# Patient Record
Sex: Female | Born: 1985
Health system: Southern US, Community
[De-identification: ages and names within clinical notes are randomized; demographics above are authoritative.]

## PROBLEM LIST (undated history)

## (undated) ENCOUNTER — Inpatient Hospital Stay (HOSPITAL_COMMUNITY): Payer: Self-pay

## (undated) DIAGNOSIS — Z5189 Encounter for other specified aftercare: Secondary | ICD-10-CM

## (undated) DIAGNOSIS — K219 Gastro-esophageal reflux disease without esophagitis: Secondary | ICD-10-CM

## (undated) DIAGNOSIS — Z87442 Personal history of urinary calculi: Secondary | ICD-10-CM

## (undated) DIAGNOSIS — R002 Palpitations: Secondary | ICD-10-CM

## (undated) DIAGNOSIS — N200 Calculus of kidney: Secondary | ICD-10-CM

## (undated) DIAGNOSIS — D649 Anemia, unspecified: Secondary | ICD-10-CM

## (undated) HISTORY — PX: DILATION AND CURETTAGE OF UTERUS: SHX78

## (undated) HISTORY — PX: FOOT SURGERY: SHX648

## (undated) HISTORY — PX: PLANTAR FASCIA RELEASE: SHX2239

---
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
## 2004-05-16 ENCOUNTER — Emergency Department (HOSPITAL_COMMUNITY): Admission: EM | Admit: 2004-05-16 | Discharge: 2004-05-17 | Payer: Self-pay | Admitting: Emergency Medicine

## 2008-09-09 ENCOUNTER — Emergency Department (HOSPITAL_COMMUNITY): Admission: EM | Admit: 2008-09-09 | Discharge: 2008-09-09 | Payer: Self-pay | Admitting: Emergency Medicine

## 2010-03-15 ENCOUNTER — Inpatient Hospital Stay (HOSPITAL_COMMUNITY)
Admission: AD | Admit: 2010-03-15 | Discharge: 2010-03-15 | Disposition: A | Discharge status: Home / Self Care | Payer: BC Managed Care – PPO | Source: Ambulatory Visit | Attending: Obstetrics and Gynecology | Admitting: Obstetrics and Gynecology

## 2010-03-15 DIAGNOSIS — IMO0002 Reserved for concepts with insufficient information to code with codable children: Secondary | ICD-10-CM | POA: Insufficient documentation

## 2010-03-15 LAB — CBC
Hemoglobin: 12.4 g/dL (ref 12.0–15.0)
MCHC: 33.8 g/dL (ref 30.0–36.0)
Platelets: 250 10*3/uL (ref 150–400)
RBC: 3.98 MIL/uL (ref 3.87–5.11)

## 2010-05-03 LAB — COMPREHENSIVE METABOLIC PANEL
AST: 21 U/L (ref 0–37)
Albumin: 4.3 g/dL (ref 3.5–5.2)
Alkaline Phosphatase: 72 U/L (ref 39–117)
CO2: 28 mEq/L (ref 19–32)
Chloride: 105 mEq/L (ref 96–112)
Creatinine, Ser: 1.05 mg/dL (ref 0.4–1.2)
GFR calc Af Amer: >60 mL/min (ref >60)
GFR calc non Af Amer: >60 mL/min (ref >60)
Potassium: 3.7 mEq/L (ref 3.5–5.1)
Total Bilirubin: 1.6 mg/dL — ABNORMAL HIGH (ref 0.3–1.2)

## 2010-05-03 LAB — POCT I-STAT, CHEM 8
Calcium, Ion: 1.18 mmol/L (ref 1.12–1.32)
Creatinine, Ser: 1 mg/dL (ref 0.4–1.2)
Glucose, Bld: 108 mg/dL — ABNORMAL HIGH (ref 70–99)
Hemoglobin: 14.6 g/dL (ref 12.0–15.0)
Potassium: 3.7 mEq/L (ref 3.5–5.1)

## 2010-05-03 LAB — URINALYSIS, ROUTINE W REFLEX MICROSCOPIC
Bilirubin Urine: NEGATIVE
Ketones, ur: 15 mg/dL — AB
Nitrite: NEGATIVE
Protein, ur: 30 mg/dL — AB
Urobilinogen, UA: 1 mg/dL (ref 0.0–1.0)

## 2010-05-03 LAB — DIFFERENTIAL
Basophils Absolute: 0.1 10*3/uL (ref 0.0–0.1)
Basophils Relative: 1 % (ref 0–1)
Eosinophils Absolute: 0.1 10*3/uL (ref 0.0–0.7)
Eosinophils Relative: 1 % (ref 0–5)
Lymphocytes Relative: 30 % (ref 12–46)
Monocytes Absolute: 0.6 10*3/uL (ref 0.1–1.0)

## 2010-05-03 LAB — URINE CULTURE
Colony Count: NO GROWTH
Culture: NO GROWTH

## 2010-05-03 LAB — CBC
HCT: 41 % (ref 36.0–46.0)
MCV: 95.3 fL (ref 78.0–100.0)
Platelets: 277 10*3/uL (ref 150–400)
RBC: 4.3 MIL/uL (ref 3.87–5.11)
WBC: 7.8 10*3/uL (ref 4.0–10.5)

## 2010-05-03 LAB — LIPASE, BLOOD: Lipase: 24 U/L (ref 11–59)

## 2010-05-03 LAB — POCT PREGNANCY, URINE: Preg Test, Ur: NEGATIVE

## 2010-05-03 LAB — URINE MICROSCOPIC-ADD ON

## 2012-10-31 ENCOUNTER — Telehealth: Payer: Self-pay | Admitting: *Deleted

## 2012-10-31 MED ORDER — DICLOFENAC SODIUM 75 MG PO TBEC: 75.0000 mg | DELAYED_RELEASE_TABLET | Freq: Two times a day (BID) | ORAL | Status: DC

## 2012-10-31 NOTE — Telephone Encounter (Signed)
RECEIVED FAX REFILL REQUEST

## 2012-11-28 ENCOUNTER — Ambulatory Visit (INDEPENDENT_AMBULATORY_CARE_PROVIDER_SITE_OTHER): Payer: BC Managed Care – PPO | Admitting: Podiatry

## 2012-11-28 ENCOUNTER — Encounter: Payer: Self-pay | Admitting: Podiatry

## 2012-11-28 VITALS — BP 127/73 | HR 79 | Resp 16 | Ht 67.0 in | Wt 210.0 lb

## 2012-11-28 DIAGNOSIS — M722 Plantar fascial fibromatosis: Secondary | ICD-10-CM

## 2012-11-28 NOTE — Patient Instructions (Signed)
Plantar Fasciitis (Heel Spur Syndrome) with Rehab The plantar fascia is a fibrous, ligament-like, soft-tissue structure that spans the bottom of the foot. Plantar fasciitis is a condition that causes pain in the foot due to inflammation of the tissue. SYMPTOMS   Pain and tenderness on the underneath side of the foot.  Pain that worsens with standing or walking. CAUSES  Plantar fasciitis is caused by irritation and injury to the plantar fascia on the underneath side of the foot. Common mechanisms of injury include:  Direct trauma to bottom of the foot.  Damage to a small nerve that runs under the foot where the main fascia attaches to the heel bone.  Stress placed on the plantar fascia due to bone spurs. RISK INCREASES WITH:   Activities that place stress on the plantar fascia (running, jumping, pivoting, or cutting).  Poor strength and flexibility.  Improperly fitted shoes.  Tight calf muscles.  Flat feet.  Failure to warm-up properly before activity.  Obesity. PREVENTION  Warm up and stretch properly before activity.  Allow for adequate recovery between workouts.  Maintain physical fitness:  Strength, flexibility, and endurance.  Cardiovascular fitness.  Maintain a health body weight.  Avoid stress on the plantar fascia.  Wear properly fitted shoes, including arch supports for individuals who have flat feet. PROGNOSIS  If treated properly, then the symptoms of plantar fasciitis usually resolve without surgery. However, occasionally surgery is necessary. RELATED COMPLICATIONS   Recurrent symptoms that may result in a chronic condition.  Problems of the lower back that are caused by compensating for the injury, such as limping.  Pain or weakness of the foot during push-off following surgery.  Chronic inflammation, scarring, and partial or complete fascia tear, occurring more often from repeated injections. TREATMENT  Treatment initially involves the use of  ice and medication to help reduce pain and inflammation. The use of strengthening and stretching exercises may help reduce pain with activity, especially stretches of the Achilles tendon. These exercises may be performed at home or with a therapist. Your caregiver may recommend that you use heel cups of arch supports to help reduce stress on the plantar fascia. Occasionally, corticosteroid injections are given to reduce inflammation. If symptoms persist for greater than 6 months despite non-surgical (conservative), then surgery may be recommended.  MEDICATION   If pain medication is necessary, then nonsteroidal anti-inflammatory medications, such as aspirin and ibuprofen, or other minor pain relievers, such as acetaminophen, are often recommended.  Do not take pain medication within 7 days before surgery.  Prescription pain relievers may be given if deemed necessary by your caregiver. Use only as directed and only as much as you need.  Corticosteroid injections may be given by your caregiver. These injections should be reserved for the most serious cases, because they may only be given a certain number of times. HEAT AND COLD  Cold treatment (icing) relieves pain and reduces inflammation. Cold treatment should be applied for 10 to 15 minutes every 2 to 3 hours for inflammation and pain and immediately after any activity that aggravates your symptoms. Use ice packs or massage the area with a piece of ice (ice massage).  Heat treatment may be used prior to performing the stretching and strengthening activities prescribed by your caregiver, physical therapist, or athletic trainer. Use a heat pack or soak the injury in warm water. SEEK IMMEDIATE MEDICAL CARE IF:  Treatment seems to offer no benefit, or the condition worsens.  Any medications produce adverse side effects. EXERCISES RANGE   OF MOTION (ROM) AND STRETCHING EXERCISES - Plantar Fasciitis (Heel Spur Syndrome) These exercises may help you  when beginning to rehabilitate your injury. Your symptoms may resolve with or without further involvement from your physician, physical therapist or athletic trainer. While completing these exercises, remember:   Restoring tissue flexibility helps normal motion to return to the joints. This allows healthier, less painful movement and activity.  An effective stretch should be held for at least 30 seconds.  A stretch should never be painful. You should only feel a gentle lengthening or release in the stretched tissue. RANGE OF MOTION - Toe Extension, Flexion  Sit with your right / left leg crossed over your opposite knee.  Grasp your toes and gently pull them back toward the top of your foot. You should feel a stretch on the bottom of your toes and/or foot.  Hold this stretch for __________ seconds.  Now, gently pull your toes toward the bottom of your foot. You should feel a stretch on the top of your toes and or foot.  Hold this stretch for __________ seconds. Repeat __________ times. Complete this stretch __________ times per day.  RANGE OF MOTION - Ankle Dorsiflexion, Active Assisted  Remove shoes and sit on a chair that is preferably not on a carpeted surface.  Place right / left foot under knee. Extend your opposite leg for support.  Keeping your heel down, slide your right / left foot back toward the chair until you feel a stretch at your ankle or calf. If you do not feel a stretch, slide your bottom forward to the edge of the chair, while still keeping your heel down.  Hold this stretch for __________ seconds. Repeat __________ times. Complete this stretch __________ times per day.  STRETCH  Gastroc, Standing  Place hands on wall.  Extend right / left leg, keeping the front knee somewhat bent.  Slightly point your toes inward on your back foot.  Keeping your right / left heel on the floor and your knee straight, shift your weight toward the wall, not allowing your back to  arch.  You should feel a gentle stretch in the right / left calf. Hold this position for __________ seconds. Repeat __________ times. Complete this stretch __________ times per day. STRETCH  Soleus, Standing  Place hands on wall.  Extend right / left leg, keeping the other knee somewhat bent.  Slightly point your toes inward on your back foot.  Keep your right / left heel on the floor, bend your back knee, and slightly shift your weight over the back leg so that you feel a gentle stretch deep in your back calf.  Hold this position for __________ seconds. Repeat __________ times. Complete this stretch __________ times per day. STRETCH  Gastrocsoleus, Standing  Note: This exercise can place a lot of stress on your foot and ankle. Please complete this exercise only if specifically instructed by your caregiver.   Place the ball of your right / left foot on a step, keeping your other foot firmly on the same step.  Hold on to the wall or a rail for balance.  Slowly lift your other foot, allowing your body weight to press your heel down over the edge of the step.  You should feel a stretch in your right / left calf.  Hold this position for __________ seconds.  Repeat this exercise with a slight bend in your right / left knee. Repeat __________ times. Complete this stretch __________ times per day.    STRENGTHENING EXERCISES - Plantar Fasciitis (Heel Spur Syndrome)  These exercises may help you when beginning to rehabilitate your injury. They may resolve your symptoms with or without further involvement from your physician, physical therapist or athletic trainer. While completing these exercises, remember:   Muscles can gain both the endurance and the strength needed for everyday activities through controlled exercises.  Complete these exercises as instructed by your physician, physical therapist or athletic trainer. Progress the resistance and repetitions only as guided. STRENGTH - Towel  Curls  Sit in a chair positioned on a non-carpeted surface.  Place your foot on a towel, keeping your heel on the floor.  Pull the towel toward your heel by only curling your toes. Keep your heel on the floor.  If instructed by your physician, physical therapist or athletic trainer, add ____________________ at the end of the towel. Repeat __________ times. Complete this exercise __________ times per day. STRENGTH - Ankle Inversion  Secure one end of a rubber exercise band/tubing to a fixed object (table, pole). Loop the other end around your foot just before your toes.  Place your fists between your knees. This will focus your strengthening at your ankle.  Slowly, pull your big toe up and in, making sure the band/tubing is positioned to resist the entire motion.  Hold this position for __________ seconds.  Have your muscles resist the band/tubing as it slowly pulls your foot back to the starting position. Repeat __________ times. Complete this exercises __________ times per day.  Document Released: 01/12/2005 Document Revised: 04/06/2011 Document Reviewed: 04/26/2008 [employer] Patient Information 2014 [employer]. Plantar Fasciitis Plantar fasciitis is a common condition that causes foot pain. It is soreness (inflammation) of the band of tough fibrous tissue on the bottom of the foot that runs from the heel bone (calcaneus) to the ball of the foot. The cause of this soreness may be from excessive standing, poor fitting shoes, running on hard surfaces, being overweight, having an abnormal walk, or overuse (this is common in runners) of the painful foot or feet. It is also common in aerobic exercise dancers and ballet dancers. SYMPTOMS  Most people with plantar fasciitis complain of:  Severe pain in the morning on the bottom of their foot especially when taking the first steps out of bed. This pain recedes after a few minutes of walking.  Severe pain is experienced also during walking  following a long period of inactivity.  Pain is worse when walking barefoot or up stairs DIAGNOSIS   Your caregiver will diagnose this condition by examining and feeling your foot.  Special tests such as X-rays of your foot, are usually not needed. PREVENTION   Consult a sports medicine professional before beginning a new exercise program.  Walking programs offer a good workout. With walking there is a lower chance of overuse injuries common to runners. There is less impact and less jarring of the joints.  Begin all new exercise programs slowly. If problems or pain develop, decrease the amount of time or distance until you are at a comfortable level.  Wear good shoes and replace them regularly.  Stretch your foot and the heel cords at the back of the ankle (Achilles tendon) both before and after exercise.  Run or exercise on even surfaces that are not hard. For example, asphalt is better than pavement.  Do not run barefoot on hard surfaces.  If using a treadmill, vary the incline.  Do not continue to workout if you have foot or joint   problems. Seek professional help if they do not improve. HOME CARE INSTRUCTIONS   Avoid activities that cause you pain until you recover.  Use ice or cold packs on the problem or painful areas after working out.  Only take over-the-counter or prescription medicines for pain, discomfort, or fever as directed by your caregiver.  Soft shoe inserts or athletic shoes with air or gel sole cushions may be helpful.  If problems continue or become more severe, consult a sports medicine caregiver or your own health care provider. Cortisone is a potent anti-inflammatory medication that may be injected into the painful area. You can discuss this treatment with your caregiver. MAKE SURE YOU:   Understand these instructions.  Will watch your condition.  Will get help right away if you are not doing well or get worse. Document Released: 10/07/2000 Document  Revised: 04/06/2011 Document Reviewed: 12/07/2007 [employer] Patient Information 2014 [employer].  

## 2012-11-28 NOTE — Progress Notes (Signed)
Cynthia English presents today for chief complaint of pain to her right foot as well. She's had pain to the left foot and is currently scheduled for an endoscopic plantar fasciotomy of her left heel. She was wondering if we could perform the same procedure to the right heel. At the same time primarily because she does a lot of take extra time off of work. I reviewed her past medical history medications and allergies.  Objective: Pulses are palpable she has pain on palpation medial calcaneal tubercle right heel.  Assessment: Plantar fasciitis bilaterally.  Plan: I reviewed her past medical history medications and allergies we discussed the pros and cons of adding the surgery to her consent form. We added an endoscopic plantar fasciotomy to the right foot and I will followup with her 12/14/2012 for surgery.

## 2012-12-01 DIAGNOSIS — B351 Tinea unguium: Secondary | ICD-10-CM

## 2012-12-15 ENCOUNTER — Encounter: Payer: Self-pay | Admitting: Podiatry

## 2012-12-15 ENCOUNTER — Other Ambulatory Visit: Payer: Self-pay | Admitting: Podiatry

## 2012-12-15 MED ORDER — PROMETHAZINE HCL 25 MG PO TABS: 25.0000 mg | ORAL_TABLET | Freq: Three times a day (TID) | ORAL | Status: DC | PRN

## 2012-12-15 MED ORDER — CEPHALEXIN 500 MG PO CAPS: 500.0000 mg | ORAL_CAPSULE | Freq: Three times a day (TID) | ORAL | Status: DC

## 2012-12-15 MED ORDER — OXYCODONE-ACETAMINOPHEN 10-325 MG PO TABS: ORAL_TABLET | ORAL | Status: DC

## 2012-12-16 ENCOUNTER — Encounter: Payer: Self-pay | Admitting: Podiatry

## 2012-12-16 DIAGNOSIS — L600 Ingrowing nail: Secondary | ICD-10-CM

## 2012-12-16 DIAGNOSIS — M722 Plantar fascial fibromatosis: Secondary | ICD-10-CM

## 2012-12-21 ENCOUNTER — Ambulatory Visit (INDEPENDENT_AMBULATORY_CARE_PROVIDER_SITE_OTHER): Payer: BC Managed Care – PPO | Admitting: Podiatry

## 2012-12-21 ENCOUNTER — Encounter: Payer: Self-pay | Admitting: Podiatry

## 2012-12-21 VITALS — BP 118/80 | HR 94 | Temp 98.8°F | Resp 16 | Ht 67.0 in

## 2012-12-21 DIAGNOSIS — Z9889 Other specified postprocedural states: Secondary | ICD-10-CM

## 2012-12-21 MED ORDER — TRAMADOL HCL 50 MG PO TABS: 50.0000 mg | ORAL_TABLET | Freq: Three times a day (TID) | ORAL | Status: DC | PRN

## 2012-12-21 NOTE — Progress Notes (Signed)
Cynthia English presents with her mother today. She is 5 day status post endoscopic plantar fasciotomy bilateral heel as well as matrixectomy to the hallux right. She states that they're too sore to walk on that they are not painful as they were previously. She denies fever chills nausea vomiting muscle aches or pains. Some nausea since only with pain medication.  Objective: Vital signs are stable she is alert and oriented x3. Dry sterile dressings was removed demonstrates well coapted margins to the medial and lateral aspect of the bilateral heels. Sutures remained intact to the heels as well as to the hallux right which demonstrate some mild erythema no saline is drainage or odor. Mild tenderness on palpation of the plantar fascial calcaneal insertion site bilateral.  Assessment: 1 week postop EPF bilateral and hallux right.  Plan: At this point we are going to allow her to start soaking her feet in Epsom salts warm water washing been with antibacterial soap. She will cover the toe with a Band-Aid at all times to wear compression anklet second bilateral feet. Continue the use of the Cam Walker his 24/7. Followup with her in one week for suture removal

## 2012-12-28 ENCOUNTER — Encounter: Payer: Self-pay | Admitting: Podiatry

## 2012-12-28 ENCOUNTER — Ambulatory Visit (INDEPENDENT_AMBULATORY_CARE_PROVIDER_SITE_OTHER): Payer: BC Managed Care – PPO | Admitting: Podiatry

## 2012-12-28 VITALS — BP 121/82 | HR 100 | Resp 16 | Ht 67.0 in | Wt 210.0 lb

## 2012-12-28 DIAGNOSIS — Z9889 Other specified postprocedural states: Secondary | ICD-10-CM

## 2012-12-28 NOTE — Progress Notes (Signed)
Cynthia English presents today for followup of her endoscopic plantar fasciotomies bilateral and her matrixectomy to the hallux right. Her left heel is doing very well she says her right heel is quite sore. She also has tenderness to the hallux right. She denies fever chills nausea vomiting muscle aches and pains. She presents today ambulating not in her wheelchair.  Objective: Sutures are intact all surgical sites. There is no erythema edema cellulitis drainage or odor. Sutures are removed margins remain well coapted.  Assessment: Well-healing surgical foot bilateral.  Plan: Continue to soak the foot Epsom salts warm water twice a day to back into her regular shoe gear wear compression socks during the day. I will followup with her in 2 weeks

## 2013-01-11 ENCOUNTER — Ambulatory Visit (INDEPENDENT_AMBULATORY_CARE_PROVIDER_SITE_OTHER): Payer: BC Managed Care – PPO

## 2013-01-11 ENCOUNTER — Ambulatory Visit (INDEPENDENT_AMBULATORY_CARE_PROVIDER_SITE_OTHER): Payer: BC Managed Care – PPO | Admitting: Podiatry

## 2013-01-11 ENCOUNTER — Encounter: Payer: Self-pay | Admitting: Podiatry

## 2013-01-11 VITALS — BP 134/83 | HR 104 | Resp 16 | Ht 67.0 in | Wt 210.0 lb

## 2013-01-11 DIAGNOSIS — Z9889 Other specified postprocedural states: Secondary | ICD-10-CM

## 2013-01-11 NOTE — Progress Notes (Signed)
Cynthia English a presents today status post endoscopic plantar fasciotomies bilateral foot matrixectomy hallux right. She relates that she still cannot stand for long periods of time. In her feet are feeling much better. She continues to soak the hallux right does admit to scrubbing the scab from the right hallux incision site. She relates feeling in hearing a pop in her right foot near the heel several days ago and it has been sore ever since but yet again better than it was previously.  Objective: Vital signs are stable she is alert and oriented x3. Pulses are palpable bilateral. Hallux right demonstrates mild erythema no edema cellulitis drainage or odor. Appears to be healing very nicely. Incision site to the bilateral heels demonstrate well-healed surgical sites. However she still has tenderness and a palpable cordlike fibrosis beneath the bilateral heel.  Assessment: Surgical feet healing are normal process. Status post EPF bilateral and matrixectomy hallux right.  Plan: She'll go back to work January 6. I have encouraged range of motion exercises as well as a size therapy and soaking. She's to wear shoes at all times no bare feet no flip flops and sandals she understands that is amenable to it I will followup with her in one month

## 2013-02-06 NOTE — Progress Notes (Signed)
1. ENDOSCOPIC PLANTAR FASCIOTOMY LEFT AND RIGHT FOOT  2. SURGICAL MATRIXECTOMY BOTH BORDERS HALLUX RIGHT

## 2013-02-08 ENCOUNTER — Ambulatory Visit (INDEPENDENT_AMBULATORY_CARE_PROVIDER_SITE_OTHER): Payer: BC Managed Care – PPO | Admitting: Podiatry

## 2013-02-08 ENCOUNTER — Encounter: Payer: Self-pay | Admitting: Podiatry

## 2013-02-08 VITALS — BP 127/81 | HR 106 | Resp 16 | Ht 67.0 in | Wt 208.0 lb

## 2013-02-08 DIAGNOSIS — Z9889 Other specified postprocedural states: Secondary | ICD-10-CM

## 2013-02-08 NOTE — Progress Notes (Signed)
Cynthia English presents today more than 2 months status post bilateral endoscopic plantar fasciotomies and single matrixectomy hallux right. She states she doing quite well the heels are finally starting to calm down.  Objective: Vital signs are stable she is alert and oriented x3 she has some tenderness on palpation along the medial longitudinal arch. Some thickening of the scars. In the matrixectomy appears to be healing well.  Assessment: Well-healing surgical feet.  Plan: Discussed etiology pathology conservative versus surgical therapies she continue her massage therapy stretching exercises and ice therapy if necessary. I will followup with her in one month if necessary.

## 2013-03-09 ENCOUNTER — Ambulatory Visit (INDEPENDENT_AMBULATORY_CARE_PROVIDER_SITE_OTHER): Payer: BC Managed Care – PPO | Admitting: Podiatry

## 2013-03-09 ENCOUNTER — Encounter: Payer: Self-pay | Admitting: Podiatry

## 2013-03-09 VITALS — BP 124/78 | HR 97 | Resp 16 | Ht 67.0 in | Wt 207.0 lb

## 2013-03-09 DIAGNOSIS — M722 Plantar fascial fibromatosis: Secondary | ICD-10-CM

## 2013-03-09 NOTE — Progress Notes (Signed)
She presents today for followup of plantar fasciitis bilaterally. She had endoscopic plantar fasciotomy performed back in November to the bilateral heel. Currently she states that her heel feels just like it did before surgery. When asked her she's been wearing her orthotics she stated noted that she has not been wearing her orthotics in her shoes.  Objective: Vital signs are stable she is alert and oriented x3. She has pain on palpation medial continued tubercles bilateral.  Assessment: Plantar fasciitis lateral cuboid impingement associated with noncompliance.  Plan: Injected the bilateral heel today and encourage use of the orthotics. 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

## 2013-04-06 ENCOUNTER — Ambulatory Visit (INDEPENDENT_AMBULATORY_CARE_PROVIDER_SITE_OTHER): Payer: BC Managed Care – PPO | Admitting: Podiatry

## 2013-04-06 VITALS — BP 113/80 | HR 95 | Resp 16 | Ht 67.0 in | Wt 208.0 lb

## 2013-04-06 DIAGNOSIS — M722 Plantar fascial fibromatosis: Secondary | ICD-10-CM

## 2013-04-06 NOTE — Patient Instructions (Signed)
Plantar Fasciitis (Heel Spur Syndrome) with Rehab The plantar fascia is a fibrous, ligament-like, soft-tissue structure that spans the bottom of the foot. Plantar fasciitis is a condition that causes pain in the foot due to inflammation of the tissue. SYMPTOMS   Pain and tenderness on the underneath side of the foot.  Pain that worsens with standing or walking. CAUSES  Plantar fasciitis is caused by irritation and injury to the plantar fascia on the underneath side of the foot. Common mechanisms of injury include:  Direct trauma to bottom of the foot.  Damage to a small nerve that runs under the foot where the main fascia attaches to the heel bone.  Stress placed on the plantar fascia due to bone spurs. RISK INCREASES WITH:   Activities that place stress on the plantar fascia (running, jumping, pivoting, or cutting).  Poor strength and flexibility.  Improperly fitted shoes.  Tight calf muscles.  Flat feet.  Failure to warm-up properly before activity.  Obesity. PREVENTION  Warm up and stretch properly before activity.  Allow for adequate recovery between workouts.  Maintain physical fitness:  Strength, flexibility, and endurance.  Cardiovascular fitness.  Maintain a health body weight.  Avoid stress on the plantar fascia.  Wear properly fitted shoes, including arch supports for individuals who have flat feet. PROGNOSIS  If treated properly, then the symptoms of plantar fasciitis usually resolve without surgery. However, occasionally surgery is necessary. RELATED COMPLICATIONS   Recurrent symptoms that may result in a chronic condition.  Problems of the lower back that are caused by compensating for the injury, such as limping.  Pain or weakness of the foot during push-off following surgery.  Chronic inflammation, scarring, and partial or complete fascia tear, occurring more often from repeated injections. TREATMENT  Treatment initially involves the use of  ice and medication to help reduce pain and inflammation. The use of strengthening and stretching exercises may help reduce pain with activity, especially stretches of the Achilles tendon. These exercises may be performed at home or with a therapist. Your caregiver may recommend that you use heel cups of arch supports to help reduce stress on the plantar fascia. Occasionally, corticosteroid injections are given to reduce inflammation. If symptoms persist for greater than 6 months despite non-surgical (conservative), then surgery may be recommended.  MEDICATION   If pain medication is necessary, then nonsteroidal anti-inflammatory medications, such as aspirin and ibuprofen, or other minor pain relievers, such as acetaminophen, are often recommended.  Do not take pain medication within 7 days before surgery.  Prescription pain relievers may be given if deemed necessary by your caregiver. Use only as directed and only as much as you need.  Corticosteroid injections may be given by your caregiver. These injections should be reserved for the most serious cases, because they may only be given a certain number of times. HEAT AND COLD  Cold treatment (icing) relieves pain and reduces inflammation. Cold treatment should be applied for 10 to 15 minutes every 2 to 3 hours for inflammation and pain and immediately after any activity that aggravates your symptoms. Use ice packs or massage the area with a piece of ice (ice massage).  Heat treatment may be used prior to performing the stretching and strengthening activities prescribed by your caregiver, physical therapist, or athletic trainer. Use a heat pack or soak the injury in warm water. SEEK IMMEDIATE MEDICAL CARE IF:  Treatment seems to offer no benefit, or the condition worsens.  Any medications produce adverse side effects. EXERCISES RANGE   OF MOTION (ROM) AND STRETCHING EXERCISES - Plantar Fasciitis (Heel Spur Syndrome) These exercises may help you  when beginning to rehabilitate your injury. Your symptoms may resolve with or without further involvement from your physician, physical therapist or athletic trainer. While completing these exercises, remember:   Restoring tissue flexibility helps normal motion to return to the joints. This allows healthier, less painful movement and activity.  An effective stretch should be held for at least 30 seconds.  A stretch should never be painful. You should only feel a gentle lengthening or release in the stretched tissue. RANGE OF MOTION - Toe Extension, Flexion  Sit with your right / left leg crossed over your opposite knee.  Grasp your toes and gently pull them back toward the top of your foot. You should feel a stretch on the bottom of your toes and/or foot.  Hold this stretch for __________ seconds.  Now, gently pull your toes toward the bottom of your foot. You should feel a stretch on the top of your toes and or foot.  Hold this stretch for __________ seconds. Repeat __________ times. Complete this stretch __________ times per day.  RANGE OF MOTION - Ankle Dorsiflexion, Active Assisted  Remove shoes and sit on a chair that is preferably not on a carpeted surface.  Place right / left foot under knee. Extend your opposite leg for support.  Keeping your heel down, slide your right / left foot back toward the chair until you feel a stretch at your ankle or calf. If you do not feel a stretch, slide your bottom forward to the edge of the chair, while still keeping your heel down.  Hold this stretch for __________ seconds. Repeat __________ times. Complete this stretch __________ times per day.  STRETCH  Gastroc, Standing  Place hands on wall.  Extend right / left leg, keeping the front knee somewhat bent.  Slightly point your toes inward on your back foot.  Keeping your right / left heel on the floor and your knee straight, shift your weight toward the wall, not allowing your back to  arch.  You should feel a gentle stretch in the right / left calf. Hold this position for __________ seconds. Repeat __________ times. Complete this stretch __________ times per day. STRETCH  Soleus, Standing  Place hands on wall.  Extend right / left leg, keeping the other knee somewhat bent.  Slightly point your toes inward on your back foot.  Keep your right / left heel on the floor, bend your back knee, and slightly shift your weight over the back leg so that you feel a gentle stretch deep in your back calf.  Hold this position for __________ seconds. Repeat __________ times. Complete this stretch __________ times per day. STRETCH  Gastrocsoleus, Standing  Note: This exercise can place a lot of stress on your foot and ankle. Please complete this exercise only if specifically instructed by your caregiver.   Place the ball of your right / left foot on a step, keeping your other foot firmly on the same step.  Hold on to the wall or a rail for balance.  Slowly lift your other foot, allowing your body weight to press your heel down over the edge of the step.  You should feel a stretch in your right / left calf.  Hold this position for __________ seconds.  Repeat this exercise with a slight bend in your right / left knee. Repeat __________ times. Complete this stretch __________ times per day.    STRENGTHENING EXERCISES - Plantar Fasciitis (Heel Spur Syndrome)  These exercises may help you when beginning to rehabilitate your injury. They may resolve your symptoms with or without further involvement from your physician, physical therapist or athletic trainer. While completing these exercises, remember:   Muscles can gain both the endurance and the strength needed for everyday activities through controlled exercises.  Complete these exercises as instructed by your physician, physical therapist or athletic trainer. Progress the resistance and repetitions only as guided. STRENGTH - Towel  Curls  Sit in a chair positioned on a non-carpeted surface.  Place your foot on a towel, keeping your heel on the floor.  Pull the towel toward your heel by only curling your toes. Keep your heel on the floor.  If instructed by your physician, physical therapist or athletic trainer, add ____________________ at the end of the towel. Repeat __________ times. Complete this exercise __________ times per day. STRENGTH - Ankle Inversion  Secure one end of a rubber exercise band/tubing to a fixed object (table, pole). Loop the other end around your foot just before your toes.  Place your fists between your knees. This will focus your strengthening at your ankle.  Slowly, pull your big toe up and in, making sure the band/tubing is positioned to resist the entire motion.  Hold this position for __________ seconds.  Have your muscles resist the band/tubing as it slowly pulls your foot back to the starting position. Repeat __________ times. Complete this exercises __________ times per day.  Document Released: 01/12/2005 Document Revised: 04/06/2011 Document Reviewed: 04/26/2008 [employer] Patient Information 2014 [employer]. Plantar Fasciitis Plantar fasciitis is a common condition that causes foot pain. It is soreness (inflammation) of the band of tough fibrous tissue on the bottom of the foot that runs from the heel bone (calcaneus) to the ball of the foot. The cause of this soreness may be from excessive standing, poor fitting shoes, running on hard surfaces, being overweight, having an abnormal walk, or overuse (this is common in runners) of the painful foot or feet. It is also common in aerobic exercise dancers and ballet dancers. SYMPTOMS  Most people with plantar fasciitis complain of:  Severe pain in the morning on the bottom of their foot especially when taking the first steps out of bed. This pain recedes after a few minutes of walking.  Severe pain is experienced also during walking  following a long period of inactivity.  Pain is worse when walking barefoot or up stairs DIAGNOSIS   Your caregiver will diagnose this condition by examining and feeling your foot.  Special tests such as X-rays of your foot, are usually not needed. PREVENTION   Consult a sports medicine professional before beginning a new exercise program.  Walking programs offer a good workout. With walking there is a lower chance of overuse injuries common to runners. There is less impact and less jarring of the joints.  Begin all new exercise programs slowly. If problems or pain develop, decrease the amount of time or distance until you are at a comfortable level.  Wear good shoes and replace them regularly.  Stretch your foot and the heel cords at the back of the ankle (Achilles tendon) both before and after exercise.  Run or exercise on even surfaces that are not hard. For example, asphalt is better than pavement.  Do not run barefoot on hard surfaces.  If using a treadmill, vary the incline.  Do not continue to workout if you have foot or joint   problems. Seek professional help if they do not improve. HOME CARE INSTRUCTIONS   Avoid activities that cause you pain until you recover.  Use ice or cold packs on the problem or painful areas after working out.  Only take over-the-counter or prescription medicines for pain, discomfort, or fever as directed by your caregiver.  Soft shoe inserts or athletic shoes with air or gel sole cushions may be helpful.  If problems continue or become more severe, consult a sports medicine caregiver or your own health care provider. Cortisone is a potent anti-inflammatory medication that may be injected into the painful area. You can discuss this treatment with your caregiver. MAKE SURE YOU:   Understand these instructions.  Will watch your condition.  Will get help right away if you are not doing well or get worse. Document Released: 10/07/2000 Document  Revised: 04/06/2011 Document Reviewed: 12/07/2007 [employer] Patient Information 2014 [employer].  

## 2013-04-07 NOTE — Progress Notes (Signed)
She presents today for followup of her plantar fasciitis. Last time she was in we injected bilateral heels with Kenalog.  Objective: Signs are stable she is alert and oriented x3. Currently she has minimal pain on palpation medial continued tubercles bilateral.  Assessment: A little resolving plantar fasciitis bilateral.  Plan: Discussed etiology pathology conservative versus surgical therapies at this point followup with her in 4 weeks she's going to continue all other conservative therapies.

## 2013-05-03 ENCOUNTER — Ambulatory Visit: Payer: BC Managed Care – PPO | Admitting: Podiatry

## 2013-06-13 ENCOUNTER — Encounter: Payer: Self-pay | Admitting: Podiatry

## 2013-10-06 ENCOUNTER — Ambulatory Visit (HOSPITAL_COMMUNITY): Payer: BC Managed Care – PPO

## 2013-10-31 ENCOUNTER — Other Ambulatory Visit (HOSPITAL_COMMUNITY): Payer: Self-pay | Admitting: Gynecology

## 2013-10-31 DIAGNOSIS — Z3141 Encounter for fertility testing: Secondary | ICD-10-CM

## 2013-11-07 ENCOUNTER — Ambulatory Visit (HOSPITAL_COMMUNITY)
Admission: RE | Admit: 2013-11-07 | Discharge: 2013-11-07 | Disposition: A | Discharge status: Home / Self Care | Payer: BC Managed Care – PPO | Source: Ambulatory Visit | Attending: Gynecology | Admitting: Gynecology

## 2013-11-07 ENCOUNTER — Encounter (INDEPENDENT_AMBULATORY_CARE_PROVIDER_SITE_OTHER): Payer: Self-pay

## 2013-11-07 DIAGNOSIS — Z3141 Encounter for fertility testing: Secondary | ICD-10-CM | POA: Diagnosis present

## 2013-11-07 MED ORDER — IOHEXOL 300 MG/ML  SOLN
10.0000 mL | Freq: Once | INTRAMUSCULAR | Status: AC | PRN
  Administered 2013-11-07: 10 mL

## 2014-01-01 ENCOUNTER — Other Ambulatory Visit (HOSPITAL_BASED_OUTPATIENT_CLINIC_OR_DEPARTMENT_OTHER): Payer: Self-pay | Admitting: Family Medicine

## 2014-01-01 ENCOUNTER — Ambulatory Visit (HOSPITAL_BASED_OUTPATIENT_CLINIC_OR_DEPARTMENT_OTHER)
Admission: RE | Admit: 2014-01-01 | Discharge: 2014-01-01 | Disposition: A | Discharge status: Home / Self Care | Payer: BC Managed Care – PPO | Source: Ambulatory Visit | Attending: Family Medicine | Admitting: Family Medicine

## 2014-01-01 ENCOUNTER — Encounter (HOSPITAL_BASED_OUTPATIENT_CLINIC_OR_DEPARTMENT_OTHER): Payer: Self-pay

## 2014-01-01 ENCOUNTER — Other Ambulatory Visit (HOSPITAL_BASED_OUTPATIENT_CLINIC_OR_DEPARTMENT_OTHER): Payer: BC Managed Care – PPO

## 2014-01-01 DIAGNOSIS — R509 Fever, unspecified: Secondary | ICD-10-CM | POA: Insufficient documentation

## 2014-01-01 DIAGNOSIS — J9811 Atelectasis: Secondary | ICD-10-CM | POA: Insufficient documentation

## 2014-01-01 DIAGNOSIS — R109 Unspecified abdominal pain: Secondary | ICD-10-CM

## 2014-01-01 DIAGNOSIS — R1033 Periumbilical pain: Secondary | ICD-10-CM | POA: Insufficient documentation

## 2014-01-01 DIAGNOSIS — R110 Nausea: Secondary | ICD-10-CM | POA: Diagnosis present

## 2014-01-01 DIAGNOSIS — R630 Anorexia: Secondary | ICD-10-CM | POA: Diagnosis not present

## 2014-01-01 DIAGNOSIS — N200 Calculus of kidney: Secondary | ICD-10-CM | POA: Insufficient documentation

## 2014-01-01 MED ORDER — IOHEXOL 300 MG/ML  SOLN
100.0000 mL | Freq: Once | INTRAMUSCULAR | Status: AC | PRN
  Administered 2014-01-01: 100 mL via INTRAVENOUS

## 2014-01-26 HISTORY — PX: DILATION AND CURETTAGE OF UTERUS: SHX78

## 2014-04-05 ENCOUNTER — Emergency Department (HOSPITAL_COMMUNITY)
Admission: EM | Admit: 2014-04-05 | Discharge: 2014-04-05 | Disposition: A | Discharge status: Home / Self Care | Payer: BLUE CROSS/BLUE SHIELD | Attending: Emergency Medicine | Admitting: Emergency Medicine

## 2014-04-05 ENCOUNTER — Encounter (HOSPITAL_COMMUNITY): Payer: Self-pay | Admitting: *Deleted

## 2014-04-05 ENCOUNTER — Emergency Department (HOSPITAL_COMMUNITY): Payer: BLUE CROSS/BLUE SHIELD

## 2014-04-05 DIAGNOSIS — N201 Calculus of ureter: Secondary | ICD-10-CM | POA: Insufficient documentation

## 2014-04-05 DIAGNOSIS — Z79899 Other long term (current) drug therapy: Secondary | ICD-10-CM | POA: Diagnosis not present

## 2014-04-05 DIAGNOSIS — R1011 Right upper quadrant pain: Secondary | ICD-10-CM | POA: Diagnosis present

## 2014-04-05 DIAGNOSIS — Z791 Long term (current) use of non-steroidal anti-inflammatories (NSAID): Secondary | ICD-10-CM | POA: Insufficient documentation

## 2014-04-05 DIAGNOSIS — R109 Unspecified abdominal pain: Secondary | ICD-10-CM

## 2014-04-05 HISTORY — DX: Calculus of kidney: N20.0

## 2014-04-05 LAB — COMPREHENSIVE METABOLIC PANEL
ALT: 24 U/L (ref 0–35)
ANION GAP: 7 (ref 5–15)
AST: 21 U/L (ref 0–37)
Albumin: 4 g/dL (ref 3.5–5.2)
Alkaline Phosphatase: 85 U/L (ref 39–117)
BILIRUBIN TOTAL: 0.7 mg/dL (ref 0.3–1.2)
BUN: 15 mg/dL (ref 6–23)
CALCIUM: 9.4 mg/dL (ref 8.4–10.5)
CO2: 26 mmol/L (ref 19–32)
CREATININE: 1.03 mg/dL (ref 0.50–1.10)
Chloride: 106 mmol/L (ref 96–112)
GFR calc Af Amer: 84 mL/min — ABNORMAL LOW (ref >90)
GFR, EST NON AFRICAN AMERICAN: 73 mL/min — AB (ref >90)
GLUCOSE: 109 mg/dL — AB (ref 70–99)
POTASSIUM: 4.1 mmol/L (ref 3.5–5.1)
Sodium: 139 mmol/L (ref 135–145)
TOTAL PROTEIN: 7.6 g/dL (ref 6.0–8.3)

## 2014-04-05 LAB — CBC WITH DIFFERENTIAL/PLATELET
Basophils Absolute: 0 10*3/uL (ref 0.0–0.1)
Basophils Relative: 0 % (ref 0–1)
Eosinophils Absolute: 0.2 10*3/uL (ref 0.0–0.7)
Eosinophils Relative: 2 % (ref 0–5)
HCT: 41.1 % (ref 36.0–46.0)
Hemoglobin: 13.6 g/dL (ref 12.0–15.0)
LYMPHS ABS: 2.5 10*3/uL (ref 0.7–4.0)
Lymphocytes Relative: 24 % (ref 12–46)
MCH: 30.7 pg (ref 26.0–34.0)
MCHC: 33.1 g/dL (ref 30.0–36.0)
MCV: 92.8 fL (ref 78.0–100.0)
MONOS PCT: 6 % (ref 3–12)
Monocytes Absolute: 0.7 10*3/uL (ref 0.1–1.0)
NEUTROS ABS: 7.2 10*3/uL (ref 1.7–7.7)
Neutrophils Relative %: 68 % (ref 43–77)
PLATELETS: 362 10*3/uL (ref 150–400)
RBC: 4.43 MIL/uL (ref 3.87–5.11)
RDW: 11.9 % (ref 11.5–15.5)
WBC: 10.6 10*3/uL — ABNORMAL HIGH (ref 4.0–10.5)

## 2014-04-05 LAB — POC URINE PREG, ED: Preg Test, Ur: POSITIVE — AB

## 2014-04-05 LAB — URINALYSIS, ROUTINE W REFLEX MICROSCOPIC
BILIRUBIN URINE: NEGATIVE
GLUCOSE, UA: NEGATIVE mg/dL
KETONES UR: NEGATIVE mg/dL
LEUKOCYTES UA: NEGATIVE
Nitrite: NEGATIVE
PH: 6.5 (ref 5.0–8.0)
Protein, ur: NEGATIVE mg/dL
Specific Gravity, Urine: 1.027 (ref 1.005–1.030)
Urobilinogen, UA: 0.2 mg/dL (ref 0.0–1.0)

## 2014-04-05 LAB — URINE MICROSCOPIC-ADD ON

## 2014-04-05 LAB — HCG, QUANTITATIVE, PREGNANCY: HCG, BETA CHAIN, QUANT, S: 295 m[IU]/mL — AB (ref <5)

## 2014-04-05 LAB — LIPASE, BLOOD: LIPASE: 37 U/L (ref 11–59)

## 2014-04-05 MED ORDER — OXYCODONE HCL 5 MG PO TABS: 5.0000 mg | ORAL_TABLET | ORAL | Status: DC | PRN

## 2014-04-05 MED ORDER — ONDANSETRON HCL 4 MG/2ML IJ SOLN
4.0000 mg | Freq: Once | INTRAMUSCULAR | Status: AC
  Administered 2014-04-05: 4 mg via INTRAVENOUS
  Filled 2014-04-05: qty 2

## 2014-04-05 MED ORDER — NAPROXEN 500 MG PO TABS: 500.0000 mg | ORAL_TABLET | Freq: Two times a day (BID) | ORAL | Status: DC

## 2014-04-05 MED ORDER — MORPHINE SULFATE 4 MG/ML IJ SOLN
4.0000 mg | Freq: Once | INTRAMUSCULAR | Status: AC
  Administered 2014-04-05: 4 mg via INTRAVENOUS
  Filled 2014-04-05: qty 1

## 2014-04-05 MED ORDER — ONDANSETRON 8 MG PO TBDP: 8.0000 mg | ORAL_TABLET | Freq: Three times a day (TID) | ORAL | Status: DC | PRN

## 2014-04-05 MED ORDER — KETOROLAC TROMETHAMINE 30 MG/ML IJ SOLN
30.0000 mg | Freq: Once | INTRAMUSCULAR | Status: AC
  Administered 2014-04-05: 30 mg via INTRAVENOUS
  Filled 2014-04-05: qty 1

## 2014-04-05 NOTE — ED Provider Notes (Signed)
CSN: [id number]     Arrival date & time 04/05/14  0113 History  This chart was scribed for [name], MD by Annye AsaAnna Dorsett, ED Scribe. This patient was seen in room B19C/B19C and the patient's care was started at 2:03 AM.    Chief Complaint  Patient presents with  . Flank Pain   Patient is a 29 y.o. female presenting with flank pain. The history is provided by the patient. No language interpreter was used.  Flank Pain Associated symptoms include abdominal pain.    HPI Comments: Cynthia English is a 29 y.o. female with past medical history of kidney stones (10 previous episodes, last episode over a year PTA) who presents to the Emergency Department complaining of intermittent right-sided flank pain and right-sided abdominal pain beginning around 23:00. Applied pressure to RUQ increases her back pain. She notes vomiting. Patient explains that her pain is significantly worse than her prior experiences with kidney stones. She took one oxycodone at symptom onset. She denies fevers, prior abdominal surgical history.   Patient had a CT in December to rule out appendicitis.   Patient had a miscarriage of twins 5 days PTA (stopped developing at 6 weeks, passed at 13 weeks); ultrasound with OB 3 days PTA confirmed that only a small amount of blood remained, no indication that D&C needed. She is spotting at this time.   Past Medical History  Diagnosis Date  . Kidney stones    Past Surgical History  Procedure Laterality Date  . Foot surgery Bilateral    No family history on file. History  Substance Use Topics  . Smoking status: Never Smoker   . Smokeless tobacco: Never Used  . Alcohol Use: No   OB History    No data available     Review of Systems  Constitutional: Negative for fever.  Gastrointestinal: Positive for vomiting and abdominal pain.  Genitourinary: Positive for flank pain.  All other systems reviewed and are negative.  Allergies  Percocet and Prednisone  Home Medications    Prior to Admission medications   Medication Sig Start Date End Date Taking? Authorizing Provider  diclofenac (VOLTAREN) 75 MG EC tablet Take 1 tablet (75 mg total) by mouth 2 (two) times daily. 10/31/12   Max T Hyatt, DPM  promethazine (PHENERGAN) 25 MG tablet Take 1 tablet (25 mg total) by mouth every 8 (eight) hours as needed for nausea or vomiting. 12/15/12   Max T Hyatt, DPM  traMADol (ULTRAM) 50 MG tablet Take 1 tablet (50 mg total) by mouth every 8 (eight) hours as needed. 12/21/12   Max T Hyatt, DPM   BP 107/71 mmHg  Pulse 75  Temp(Src) 98 F (36.7 C) (Oral)  Resp 20  Ht 5\' 7"  (1.702 m)  Wt 215 lb (97.523 kg)  BMI 33.67 kg/m2  SpO2 100% Physical Exam  Constitutional: She is oriented to person, place, and time. She appears well-developed and well-nourished. She appears distressed (Visibly in pain).  HENT:  Head: Normocephalic and atraumatic.  Mouth/Throat: Oropharynx is clear and moist. No oropharyngeal exudate.  Moist mucous membranes  Eyes: EOM are normal. Pupils are equal, round, and reactive to light.  Neck: Normal range of motion. Neck supple. No JVD present.  Cardiovascular: Normal rate, regular rhythm, normal heart sounds and intact distal pulses.  Exam reveals no gallop and no friction rub.   No murmur heard. Pulmonary/Chest: Effort normal and breath sounds normal. No respiratory distress. She has no wheezes. She has no rales.  Abdominal:  Soft. Bowel sounds are normal. She exhibits no mass. There is tenderness (Mild diffuse abdominal pain). There is no rebound and no guarding.  Musculoskeletal: Normal range of motion. She exhibits no edema or tenderness.  Moves all extremities normally.   Lymphadenopathy:    She has no cervical adenopathy.  Neurological: She is alert and oriented to person, place, and time. She displays normal reflexes. She exhibits normal muscle tone. Coordination normal.  Skin: Skin is warm and dry. No rash noted.  Psychiatric: She has a normal  mood and affect. Her behavior is normal. Judgment and thought content normal.  Nursing note and vitals reviewed.   ED Course  Procedures   DIAGNOSTIC STUDIES: Oxygen Saturation is 100% on RA, normal by my interpretation.    COORDINATION OF CARE: 2:09 AM Discussed treatment plan with pt at bedside and pt agreed to plan.   Labs Review Labs Reviewed  CBC WITH DIFFERENTIAL/PLATELET - Abnormal; Notable for the following:    WBC 10.6 (*)    All other components within normal limits  COMPREHENSIVE METABOLIC PANEL - Abnormal; Notable for the following:    Glucose, Bld 109 (*)    GFR calc non Af Amer 73 (*)    GFR calc Af Amer 84 (*)    All other components within normal limits  URINALYSIS, ROUTINE W REFLEX MICROSCOPIC - Abnormal; Notable for the following:    Color, Urine AMBER (*)    APPearance CLOUDY (*)    Hgb urine dipstick LARGE (*)    All other components within normal limits  HCG, QUANTITATIVE, PREGNANCY - Abnormal; Notable for the following:    hCG, Beta Chain, Quant, S 295 (*)    All other components within normal limits  POC URINE PREG, ED - Abnormal; Notable for the following:    Preg Test, Ur POSITIVE (*)    All other components within normal limits  LIPASE, BLOOD  URINE MICROSCOPIC-ADD ON    Imaging Review Ct Renal Stone Study  04/05/2014   CLINICAL DATA:  Right flank pain for 4 hours.  Recent miscarriage.  EXAM: CT ABDOMEN AND PELVIS WITHOUT CONTRAST  TECHNIQUE: Multidetector CT imaging of the abdomen and pelvis was performed following the standard protocol without IV contrast.  COMPARISON:  CT 01/01/2014  FINDINGS: The included lung bases are clear.  There is an obstructing 3 mm stone at the right ureterovesicular junction with resultant mild hydroureteronephrosis and perinephric stranding. No additional nonobstructing stones. Left kidney is normal.  The unenhanced liver, spleen, and, pancreas, and adrenal glands are normal. There are no dilated or thickened bowel  loops. Stomach is physiologically distended. Small volume of stool throughout the colon. The appendix is normal. No free air, free fluid, or intra-abdominal fluid collection.  Abdominal aorta is normal in caliber. No retroperitoneal adenopathy.  Within the pelvis the urinary bladder is near completely decompressed. No adnexal mass. No pelvic free fluid.  There are no acute or suspicious osseous abnormalities.  IMPRESSION: Obstructing 3 mm stone at the right ureterovesicular junction with resultant mild hydroureteronephrosis and perinephric stranding.   Electronically Signed   By: Rubye Oaks M.D.   On: 04/05/2014 02:52     EKG Interpretation None      MDM   Final diagnoses:  Right flank pain  Right ureteral stone   29 year old female with right flank pain.  History of kidney stones.  Patient also with recent miscarriage on Saturday.  Negative ultrasound and OB office on Monday for retained products of conception.  Pain is somewhat different than prior kidney stones, but is right flank crampy in nature and associated with nausea and vomiting.  CT scan with 3 mm stone at [facility].  Patient updated on findings and plan.  She is feeling much better after pain medication.  She is stable for discharge home.  I personally performed the services described in this documentation, which was scribed in my presence. The recorded information has been reviewed and is accurate.       Marisa Severin, MD 04/05/14 [phone]

## 2014-04-05 NOTE — ED Notes (Signed)
Pt changed into gown, placed on pulse ox, b/p cuff, warm blanket given. Call bell in reach, family at bedside

## 2014-04-05 NOTE — ED Notes (Signed)
Pt c/o flank pain to rain side since 2300. States she has had 10 kidney stones and states that this is the most severe pain she has had. Pt states she had a miscarriage Saturday and has followed up and been cleared by her OB/GYN.

## 2014-04-05 NOTE — ED Notes (Signed)
Pt took 1 oxycodone at 2300 on 04/04/14

## 2014-04-05 NOTE — Discharge Instructions (Signed)
You have a 3 mm stone about passenger bladder.  Take medications as prescribed.  Follow-up with your urologist.  Return to the emergency part for worsening condition or new concerning symptoms.    Kidney Stones Kidney stones (urolithiasis) are deposits that form inside your kidneys. The intense pain is caused by the stone moving through the urinary tract. When the stone moves, the ureter goes into spasm around the stone. The stone is usually passed in the urine.  CAUSES   A disorder that makes certain neck glands produce too much parathyroid hormone (primary hyperparathyroidism).  A buildup of uric acid crystals, similar to gout in your joints.  Narrowing (stricture) of the ureter.  A kidney obstruction present at birth (congenital obstruction).  Previous surgery on the kidney or ureters.  Numerous kidney infections. SYMPTOMS   Feeling sick to your stomach (nauseous).  Throwing up (vomiting).  Blood in the urine (hematuria).  Pain that usually spreads (radiates) to the groin.  Frequency or urgency of urination. DIAGNOSIS   Taking a history and physical exam.  Blood or urine tests.  CT scan.  Occasionally, an examination of the inside of the urinary bladder (cystoscopy) is performed. TREATMENT   Observation.  Increasing your fluid intake.  Extracorporeal shock wave lithotripsy--This is a noninvasive procedure that uses shock waves to break up kidney stones.  Surgery may be needed if you have severe pain or persistent obstruction. There are various surgical procedures. Most of the procedures are performed with the use of small instruments. Only small incisions are needed to accommodate these instruments, so recovery time is minimized. The size, location, and chemical composition are all important variables that will determine the proper choice of action for you. Talk to your health care provider to better understand your situation so that you will minimize the risk of  injury to yourself and your kidney.  HOME CARE INSTRUCTIONS   Drink enough water and fluids to keep your urine clear or pale yellow. This will help you to pass the stone or stone fragments.  Strain all urine through the provided strainer. Keep all particulate matter and stones for your health care provider to see. The stone causing the pain may be as small as a grain of salt. It is very important to use the strainer each and every time you pass your urine. The collection of your stone will allow your health care provider to analyze it and verify that a stone has actually passed. The stone analysis will often identify what you can do to reduce the incidence of recurrences.  Only take over-the-counter or prescription medicines for pain, discomfort, or fever as directed by your health care provider.  Make a follow-up appointment with your health care provider as directed.  Get follow-up X-rays if required. The absence of pain does not always mean that the stone has passed. It may have only stopped moving. If the urine remains completely obstructed, it can cause loss of kidney function or even complete destruction of the kidney. It is your responsibility to make sure X-rays and follow-ups are completed. Ultrasounds of the kidney can show blockages and the status of the kidney. Ultrasounds are not associated with any radiation and can be performed easily in a matter of minutes. SEEK MEDICAL CARE IF:  You experience pain that is progressive and unresponsive to any pain medicine you have been prescribed. SEEK IMMEDIATE MEDICAL CARE IF:   Pain cannot be controlled with the prescribed medicine.  You have a fever or shaking  chills.  The severity or intensity of pain increases over 18 hours and is not relieved by pain medicine.  You develop a new onset of abdominal pain.  You feel faint or pass out.  You are unable to urinate. MAKE SURE YOU:   Understand these instructions.  Will watch your  condition.  Will get help right away if you are not doing well or get worse. Document Released: 01/12/2005 Document Revised: 09/14/2012 Document Reviewed: 06/15/2012 [facility] Patient Information 2015 [place], [place]. This information is not intended to replace advice given to you by your health care provider. Make sure you discuss any questions you have with your health care provider.

## 2014-07-30 ENCOUNTER — Encounter (HOSPITAL_COMMUNITY): Payer: Self-pay | Admitting: *Deleted

## 2014-07-30 ENCOUNTER — Emergency Department (HOSPITAL_COMMUNITY)
Admission: EM | Admit: 2014-07-30 | Discharge: 2014-07-30 | Disposition: A | Discharge status: Home / Self Care | Payer: BLUE CROSS/BLUE SHIELD | Attending: Emergency Medicine | Admitting: Emergency Medicine

## 2014-07-30 ENCOUNTER — Emergency Department (HOSPITAL_COMMUNITY): Payer: BLUE CROSS/BLUE SHIELD

## 2014-07-30 DIAGNOSIS — R112 Nausea with vomiting, unspecified: Secondary | ICD-10-CM | POA: Diagnosis not present

## 2014-07-30 DIAGNOSIS — R1013 Epigastric pain: Secondary | ICD-10-CM | POA: Diagnosis not present

## 2014-07-30 DIAGNOSIS — Z87442 Personal history of urinary calculi: Secondary | ICD-10-CM | POA: Diagnosis not present

## 2014-07-30 DIAGNOSIS — R079 Chest pain, unspecified: Secondary | ICD-10-CM | POA: Diagnosis present

## 2014-07-30 DIAGNOSIS — K219 Gastro-esophageal reflux disease without esophagitis: Secondary | ICD-10-CM | POA: Diagnosis not present

## 2014-07-30 DIAGNOSIS — R0602 Shortness of breath: Secondary | ICD-10-CM | POA: Insufficient documentation

## 2014-07-30 DIAGNOSIS — M545 Low back pain: Secondary | ICD-10-CM | POA: Insufficient documentation

## 2014-07-30 DIAGNOSIS — Z3202 Encounter for pregnancy test, result negative: Secondary | ICD-10-CM | POA: Insufficient documentation

## 2014-07-30 LAB — HEPATIC FUNCTION PANEL
ALBUMIN: 3.9 g/dL (ref 3.5–5.0)
ALK PHOS: 83 U/L (ref 38–126)
ALT: 19 U/L (ref 14–54)
AST: 22 U/L (ref 15–41)
BILIRUBIN DIRECT: 0.1 mg/dL (ref 0.1–0.5)
BILIRUBIN INDIRECT: 0.6 mg/dL (ref 0.3–0.9)
BILIRUBIN TOTAL: 0.7 mg/dL (ref 0.3–1.2)
Total Protein: 7.7 g/dL (ref 6.5–8.1)

## 2014-07-30 LAB — CBC
HCT: 43.1 % (ref 36.0–46.0)
HEMOGLOBIN: 14.3 g/dL (ref 12.0–15.0)
MCH: 30.6 pg (ref 26.0–34.0)
MCHC: 33.2 g/dL (ref 30.0–36.0)
MCV: 92.3 fL (ref 78.0–100.0)
PLATELETS: 345 10*3/uL (ref 150–400)
RBC: 4.67 MIL/uL (ref 3.87–5.11)
RDW: 12.1 % (ref 11.5–15.5)
WBC: 7.7 10*3/uL (ref 4.0–10.5)

## 2014-07-30 LAB — BASIC METABOLIC PANEL
Anion gap: 7 (ref 5–15)
BUN: 11 mg/dL (ref 6–20)
CHLORIDE: 105 mmol/L (ref 101–111)
CO2: 27 mmol/L (ref 22–32)
Calcium: 9.2 mg/dL (ref 8.9–10.3)
Creatinine, Ser: 0.96 mg/dL (ref 0.44–1.00)
Glucose, Bld: 88 mg/dL (ref 65–99)
Potassium: 3.8 mmol/L (ref 3.5–5.1)
Sodium: 139 mmol/L (ref 135–145)

## 2014-07-30 LAB — I-STAT TROPONIN, ED: TROPONIN I, POC: 0 ng/mL (ref 0.00–0.08)

## 2014-07-30 LAB — D-DIMER, QUANTITATIVE (NOT AT ARMC): D DIMER QUANT: 0.42 ug{FEU}/mL (ref 0.00–0.48)

## 2014-07-30 LAB — BRAIN NATRIURETIC PEPTIDE: B Natriuretic Peptide: 23.4 pg/mL (ref 0.0–100.0)

## 2014-07-30 LAB — POC URINE PREG, ED: PREG TEST UR: NEGATIVE

## 2014-07-30 LAB — LIPASE, BLOOD: Lipase: 31 U/L (ref 22–51)

## 2014-07-30 MED ORDER — SUCRALFATE 1 G PO TABS
1.0000 g | ORAL_TABLET | Freq: Three times a day (TID) | ORAL | Status: DC
Start: 2014-07-30 — End: 2014-09-05

## 2014-07-30 MED ORDER — LIDOCAINE VISCOUS 2 % MT SOLN
15.0000 mL | Freq: Once | OROMUCOSAL | Status: DC
  Filled 2014-07-30: qty 15

## 2014-07-30 MED ORDER — ALUM & MAG HYDROXIDE-SIMETH 200-200-20 MG/5ML PO SUSP
30.0000 mL | Freq: Once | ORAL | Status: DC
  Filled 2014-07-30: qty 30

## 2014-07-30 MED ORDER — GI COCKTAIL ~~LOC~~
30.0000 mL | Freq: Once | ORAL | Status: AC
  Administered 2014-07-30: 30 mL via ORAL
  Filled 2014-07-30: qty 30

## 2014-07-30 MED ORDER — PANTOPRAZOLE SODIUM 20 MG PO TBEC: 20.0000 mg | DELAYED_RELEASE_TABLET | Freq: Every day | ORAL | Status: DC

## 2014-07-30 NOTE — ED Notes (Signed)
MD at bedside. 

## 2014-07-30 NOTE — ED Provider Notes (Signed)
CSN: [id number]     Arrival date & time 07/30/14  1754 History   First MD Initiated Contact with Patient 07/30/14 1836     Chief Complaint  Patient presents with  . Chest Pain    (Consider location/radiation/quality/duration/timing/severity/associated sxs/prior Treatment) Patient is a 29 y.o. female presenting with chest pain. The history is provided by the patient.  Chest Pain Pain location:  Epigastric Pain quality: pressure   Pain radiates to:  Does not radiate Pain radiates to the back: yes   Pain severity:  Moderate Onset quality:  Gradual Timing:  Intermittent Progression:  Waxing and waning Chronicity:  New Context: eating and at rest   Context: not breathing   Relieved by:  Nothing Worsened by:  Nothing tried Ineffective treatments:  Antacids, certain positions and rest Associated symptoms: abdominal pain, back pain, heartburn, nausea, shortness of breath and vomiting   Associated symptoms: no altered mental status, no anorexia, no anxiety, no cough, no diaphoresis, no dizziness, no dysphagia, no fatigue, no fever, no headache, no palpitations and no weakness   Risk factors: obesity   Risk factors: no coronary artery disease, no diabetes mellitus, no high cholesterol, no hypertension, no immobilization, not pregnant, no prior DVT/PE and no smoking     Past Medical History  Diagnosis Date  . Kidney stones    Past Surgical History  Procedure Laterality Date  . Foot surgery Bilateral    No family history on file. History  Substance Use Topics  . Smoking status: Never Smoker   . Smokeless tobacco: Never Used  . Alcohol Use: No   OB History    No data available     Review of Systems  Constitutional: Negative for fever, diaphoresis and fatigue.  HENT: Negative for trouble swallowing.   Respiratory: Positive for chest tightness and shortness of breath. Negative for cough.   Cardiovascular: Positive for chest pain. Negative for palpitations.  Gastrointestinal:  Positive for heartburn, nausea, vomiting and abdominal pain. Negative for anorexia.  Musculoskeletal: Positive for back pain.  Neurological: Negative for dizziness, weakness, light-headedness and headaches.  Psychiatric/Behavioral: Negative for confusion.  All other systems reviewed and are negative.     Allergies  Percocet and Prednisone  Home Medications   Prior to Admission medications   Medication Sig Start Date End Date Taking? Authorizing Provider  pantoprazole (PROTONIX) 20 MG tablet Take 1 tablet (20 mg total) by mouth daily. 07/30/14   Lenell AntuJamie Tedd Cottrill, MD  sucralfate (CARAFATE) 1 G tablet Take 1 tablet (1 g total) by mouth 4 (four) times daily -  with meals and at bedtime. 07/30/14   Gwyneth SproutWhitney Plunkett, MD   BP 132/95 mmHg  Pulse 106  Temp(Src) 98 F (36.7 C) (Oral)  Resp 18  Ht 5\' 7"  (1.702 m)  Wt 216 lb 3.2 oz (98.068 kg)  BMI 33.85 kg/m2  SpO2 100%  LMP 07/09/2014  Filed Vitals:   07/30/14 2000 07/30/14 2015 07/30/14 2030 07/30/14 2044  BP: 102/67 102/61 103/66 103/66  Pulse: 80 77 92 85  Temp:      TempSrc:      Resp: 12 16 19 21   Height:      Weight:      SpO2: 100% 99% 99% 99%     Physical Exam  Constitutional: She is oriented to person, place, and time. She appears well-developed and well-nourished. No distress.  Well appearing, in NAD  HENT:  Head: Normocephalic and atraumatic.  Nose: Nose normal.  Mouth/Throat: Oropharynx is clear and moist. No  oropharyngeal exudate.  Eyes: Conjunctivae and EOM are normal. Pupils are equal, round, and reactive to light.  Neck: Normal range of motion. Neck supple.  Cardiovascular: Normal rate, regular rhythm, normal heart sounds and intact distal pulses.   No murmur heard. Pulmonary/Chest: Effort normal and breath sounds normal. No respiratory distress. She has no wheezes. She exhibits no tenderness.  No reproducible chest tenderness to palpation, normal work of breathing  Abdominal: Soft. There is tenderness. There is  no rebound and no guarding.  Soft, nondistended.  Mild tenderness to epigastric area and LUQ Negative Murphy's sign  Musculoskeletal: Normal range of motion. She exhibits no tenderness.  Lymphadenopathy:    She has no cervical adenopathy.  Neurological: She is alert and oriented to person, place, and time. No cranial nerve deficit. Coordination normal.  Skin: Skin is warm and dry. She is not diaphoretic.  Psychiatric: She has a normal mood and affect. Her behavior is normal. Judgment and thought content normal.  Nursing note and vitals reviewed.   ED Course  Procedures (including critical care time) Labs Review Labs Reviewed  CBC  BASIC METABOLIC PANEL  BRAIN NATRIURETIC PEPTIDE  D-DIMER, QUANTITATIVE (NOT AT [facility])  HEPATIC FUNCTION PANEL  LIPASE, BLOOD  I-STAT TROPOININ, ED  POC URINE PREG, ED    Imaging Review Dg Chest Port 1 View  07/30/2014   CLINICAL DATA:  Subacute onset of mid sternal chest pain and shortness of breath. Lightheadedness. Initial encounter.  EXAM: PORTABLE CHEST - 1 VIEW  COMPARISON:  None.  FINDINGS: The lungs are well-aerated and clear. There is no evidence of focal opacification, pleural effusion or pneumothorax.  The cardiomediastinal silhouette is within normal limits. No acute osseous abnormalities are seen.  IMPRESSION: No acute cardiopulmonary process seen.   Electronically Signed   By: Roanna Raider M.D.   On: 07/30/2014 18:39     EKG Interpretation   Date/Time:  Monday July 30 2014 17:59:29 EDT Ventricular Rate:  98 PR Interval:  150 QRS Duration: 86 QT Interval:  344 QTC Calculation: 439 R Axis:   92 Text Interpretation:  Normal sinus rhythm Rightward axis No significant  change since last tracing Confirmed by Anitra Lauth  MD, Alphonzo Lemmings ([id number]) on  07/30/2014 6:05:20 PM      MDM   Final diagnoses:  Gastroesophageal reflux disease, esophagitis presence not specified    Pt is a 29 yo F with hx of kidney stones and PCOS who presents  complaining of 2 weeks of epigastric pain and chest pain.  Complains of daily episodes of epigastric pain that radiates to her lower sternum and back.  Moderate cramping, pressure like sensation.  Usually happens after eating.  Associated with nausea, anxiety, SOB, and rare vomiting.  Worse at night.  Has tried Tums without improvement.  No personal or family hx of CAD in < 55 yo or PEs.  Denies any calf swelling or pain.  Did have a long trip to [place] a few days before her xx started.  Doubt PE based on daily pain that is usually associated with food but will need to rule out.  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  Will send d-dimer along with belly labs.   Given GI cocktail and she reported relief of all sx.    BNP low and troponin 0.00.  No leukocytosis, no anemia, normal electrolytes and kidney function.   Normal LFTs and lipase.   CXR benign, no evidence of hiatal  hernia.  EKG benign, no signs of ischemia   Doubt acute emergent pathology based on today's exam and work up.  Do not believe patient needs additional imaging.  Will treat for GERD with Rx for zantac.  Encouraged pt to f/u with PCP in 1-2 weeks to ensure that her sx have improved.  Given ED return precautions if her pain worsens, changes, or with any other concerns.  All questions were answered and she was discharged in good condition.    If performed, labs, EKGs, and imaging were reviewed and interpreted by myself and my attending, and incorporated in the medical decision making.  Patient was seen with ED Attending, Dr. Lawernce Keas, MD     Lenell Antu, MD 07/31/14 1425  Gwyneth Sprout, MD 08/02/14 1540

## 2014-07-30 NOTE — ED Notes (Signed)
Pt c/p CP x 2 weeks with associated lightheadedness.

## 2014-09-05 ENCOUNTER — Ambulatory Visit (INDEPENDENT_AMBULATORY_CARE_PROVIDER_SITE_OTHER): Payer: BLUE CROSS/BLUE SHIELD | Admitting: Podiatry

## 2014-09-05 ENCOUNTER — Ambulatory Visit (INDEPENDENT_AMBULATORY_CARE_PROVIDER_SITE_OTHER): Payer: BLUE CROSS/BLUE SHIELD

## 2014-09-05 VITALS — BP 113/79 | HR 86 | Resp 16

## 2014-09-05 DIAGNOSIS — M722 Plantar fascial fibromatosis: Secondary | ICD-10-CM

## 2014-09-05 MED ORDER — DICLOFENAC SODIUM 75 MG PO TBEC: 75.0000 mg | DELAYED_RELEASE_TABLET | Freq: Two times a day (BID) | ORAL | Status: DC

## 2014-09-05 NOTE — Progress Notes (Signed)
She presents today after having not seen her for a few months with a chief complaint of bilateral heel pain. She states this seems to be getting worse rather than better and continues to wear her orthotics with her tennis shoes anytime she is not working. She denies changes in her past medical history medications allergies surgery social history.  Objective: Vital signs are stable alert and oriented 3. Pulses are strongly palpable. Neurologic sensorium is intact. She has pain on palpation medial calcaneal tubercle bilateral.  Assessment: Plantar fasciitis bilateral. Gastroc equinus bilateral.  Plan: Discussed etiology and pathology conservative versus surgical therapies. Reinjected the bilateral heels today with Kenalog and local aesthetic started her on diclofenac 75 mg 1 by mouth twice a day. She was scanned for a new set of orthotics and dispensed plantar fascial braces. I will follow up with her once her orthotics arrive.

## 2014-09-26 ENCOUNTER — Encounter: Payer: Self-pay | Admitting: Podiatry

## 2014-09-26 ENCOUNTER — Ambulatory Visit (INDEPENDENT_AMBULATORY_CARE_PROVIDER_SITE_OTHER): Payer: BLUE CROSS/BLUE SHIELD | Admitting: Podiatry

## 2014-09-26 VITALS — BP 134/75 | HR 77 | Resp 18

## 2014-09-26 DIAGNOSIS — M722 Plantar fascial fibromatosis: Secondary | ICD-10-CM

## 2014-09-26 NOTE — Patient Instructions (Signed)

## 2014-09-26 NOTE — Progress Notes (Signed)
Cynthia English presents today to pick up her orthotics. She was given both oral and written going home going instructions for care and use of her orthotics. I will follow-up with her in 1 month if necessary.

## 2015-02-27 ENCOUNTER — Ambulatory Visit
Admission: RE | Admit: 2015-02-27 | Discharge: 2015-02-27 | Disposition: A | Discharge status: Home / Self Care | Payer: BLUE CROSS/BLUE SHIELD | Source: Ambulatory Visit | Attending: Internal Medicine | Admitting: Internal Medicine

## 2015-02-27 ENCOUNTER — Other Ambulatory Visit: Payer: Self-pay | Admitting: Internal Medicine

## 2015-02-27 DIAGNOSIS — M542 Cervicalgia: Secondary | ICD-10-CM

## 2015-05-07 DIAGNOSIS — Z3161 Procreative counseling and advice using natural family planning: Secondary | ICD-10-CM | POA: Diagnosis not present

## 2015-05-07 DIAGNOSIS — E288 Other ovarian dysfunction: Secondary | ICD-10-CM | POA: Diagnosis not present

## 2015-05-07 DIAGNOSIS — N978 Female infertility of other origin: Secondary | ICD-10-CM | POA: Diagnosis not present

## 2015-05-10 DIAGNOSIS — E288 Other ovarian dysfunction: Secondary | ICD-10-CM | POA: Diagnosis not present

## 2015-05-14 DIAGNOSIS — N978 Female infertility of other origin: Secondary | ICD-10-CM | POA: Diagnosis not present

## 2015-05-14 DIAGNOSIS — Z319 Encounter for procreative management, unspecified: Secondary | ICD-10-CM | POA: Diagnosis not present

## 2015-05-14 DIAGNOSIS — E288 Other ovarian dysfunction: Secondary | ICD-10-CM | POA: Diagnosis not present

## 2015-05-28 DIAGNOSIS — N978 Female infertility of other origin: Secondary | ICD-10-CM | POA: Diagnosis not present

## 2015-05-28 DIAGNOSIS — N8301 Follicular cyst of right ovary: Secondary | ICD-10-CM | POA: Diagnosis not present

## 2015-05-28 DIAGNOSIS — E288 Other ovarian dysfunction: Secondary | ICD-10-CM | POA: Diagnosis not present

## 2015-06-03 DIAGNOSIS — N978 Female infertility of other origin: Secondary | ICD-10-CM | POA: Diagnosis not present

## 2015-06-03 DIAGNOSIS — N83291 Other ovarian cyst, right side: Secondary | ICD-10-CM | POA: Diagnosis not present

## 2015-06-03 DIAGNOSIS — R102 Pelvic and perineal pain: Secondary | ICD-10-CM | POA: Diagnosis not present

## 2015-06-03 DIAGNOSIS — R1084 Generalized abdominal pain: Secondary | ICD-10-CM | POA: Diagnosis not present

## 2015-06-18 DIAGNOSIS — N978 Female infertility of other origin: Secondary | ICD-10-CM | POA: Diagnosis not present

## 2015-06-18 DIAGNOSIS — N83291 Other ovarian cyst, right side: Secondary | ICD-10-CM | POA: Diagnosis not present

## 2015-06-18 DIAGNOSIS — Z89019 Acquired absence of unspecified thumb: Secondary | ICD-10-CM | POA: Diagnosis not present

## 2015-07-01 DIAGNOSIS — N83299 Other ovarian cyst, unspecified side: Secondary | ICD-10-CM | POA: Diagnosis not present

## 2015-07-03 DIAGNOSIS — D2239 Melanocytic nevi of other parts of face: Secondary | ICD-10-CM | POA: Diagnosis not present

## 2015-07-03 DIAGNOSIS — D225 Melanocytic nevi of trunk: Secondary | ICD-10-CM | POA: Diagnosis not present

## 2015-07-09 DIAGNOSIS — H02814 Retained foreign body in left upper eyelid: Secondary | ICD-10-CM | POA: Diagnosis not present

## 2015-07-12 DIAGNOSIS — Z319 Encounter for procreative management, unspecified: Secondary | ICD-10-CM | POA: Diagnosis not present

## 2015-07-15 DIAGNOSIS — Z319 Encounter for procreative management, unspecified: Secondary | ICD-10-CM | POA: Diagnosis not present

## 2015-07-17 DIAGNOSIS — Z3189 Encounter for other procreative management: Secondary | ICD-10-CM | POA: Diagnosis not present

## 2015-08-12 ENCOUNTER — Ambulatory Visit
Admission: RE | Admit: 2015-08-12 | Discharge: 2015-08-12 | Disposition: A | Discharge status: Home / Self Care | Payer: BLUE CROSS/BLUE SHIELD | Source: Ambulatory Visit | Attending: Nurse Practitioner | Admitting: Nurse Practitioner

## 2015-08-12 ENCOUNTER — Other Ambulatory Visit: Payer: Self-pay | Admitting: Nurse Practitioner

## 2015-08-12 DIAGNOSIS — R34 Anuria and oliguria: Secondary | ICD-10-CM | POA: Diagnosis not present

## 2015-08-12 DIAGNOSIS — R112 Nausea with vomiting, unspecified: Secondary | ICD-10-CM

## 2015-08-12 DIAGNOSIS — R197 Diarrhea, unspecified: Secondary | ICD-10-CM

## 2015-08-12 DIAGNOSIS — R1084 Generalized abdominal pain: Secondary | ICD-10-CM | POA: Diagnosis not present

## 2015-08-12 DIAGNOSIS — K7689 Other specified diseases of liver: Secondary | ICD-10-CM | POA: Diagnosis not present

## 2015-08-12 DIAGNOSIS — R1011 Right upper quadrant pain: Secondary | ICD-10-CM

## 2015-08-13 ENCOUNTER — Ambulatory Visit (HOSPITAL_COMMUNITY)
Admission: RE | Admit: 2015-08-13 | Discharge: 2015-08-13 | Disposition: A | Discharge status: Home / Self Care | Payer: BLUE CROSS/BLUE SHIELD | Source: Ambulatory Visit | Attending: Nurse Practitioner | Admitting: Nurse Practitioner

## 2015-08-13 ENCOUNTER — Other Ambulatory Visit (HOSPITAL_COMMUNITY): Payer: Self-pay | Admitting: Nurse Practitioner

## 2015-08-13 DIAGNOSIS — E282 Polycystic ovarian syndrome: Secondary | ICD-10-CM | POA: Diagnosis not present

## 2015-08-13 DIAGNOSIS — K769 Liver disease, unspecified: Secondary | ICD-10-CM

## 2015-08-13 DIAGNOSIS — Z319 Encounter for procreative management, unspecified: Secondary | ICD-10-CM | POA: Diagnosis not present

## 2015-08-13 DIAGNOSIS — K760 Fatty (change of) liver, not elsewhere classified: Secondary | ICD-10-CM | POA: Insufficient documentation

## 2015-08-13 DIAGNOSIS — K7689 Other specified diseases of liver: Secondary | ICD-10-CM | POA: Insufficient documentation

## 2015-08-13 MED ORDER — GADOXETATE DISODIUM 0.25 MMOL/ML IV SOLN
10.0000 mL | Freq: Once | INTRAVENOUS | Status: AC | PRN
  Administered 2015-08-13: 10 mL via INTRAVENOUS

## 2015-09-04 ENCOUNTER — Encounter: Payer: Self-pay | Admitting: Podiatry

## 2015-09-04 ENCOUNTER — Ambulatory Visit (INDEPENDENT_AMBULATORY_CARE_PROVIDER_SITE_OTHER): Payer: BLUE CROSS/BLUE SHIELD | Admitting: Podiatry

## 2015-09-04 DIAGNOSIS — M722 Plantar fascial fibromatosis: Secondary | ICD-10-CM

## 2015-09-04 NOTE — Progress Notes (Signed)
She presents today for chief complaint of bilateral heel pain.  Objective: Vital signs are stable alert and oriented 3. His pulses are palpable. She has pain on direct for patient to continue tubercles bilateral.  Assessment: Chronic intractable plantar fasciitis bilateral.  Plan: Injected bilateral heels lived alone local anesthetic provided plantar fascia strapping bilaterally.

## 2015-09-10 DIAGNOSIS — Z319 Encounter for procreative management, unspecified: Secondary | ICD-10-CM | POA: Diagnosis not present

## 2015-09-10 DIAGNOSIS — E282 Polycystic ovarian syndrome: Secondary | ICD-10-CM | POA: Diagnosis not present

## 2015-09-12 DIAGNOSIS — Z3189 Encounter for other procreative management: Secondary | ICD-10-CM | POA: Diagnosis not present

## 2015-09-12 DIAGNOSIS — E282 Polycystic ovarian syndrome: Secondary | ICD-10-CM | POA: Diagnosis not present

## 2015-10-02 ENCOUNTER — Ambulatory Visit (INDEPENDENT_AMBULATORY_CARE_PROVIDER_SITE_OTHER): Payer: BLUE CROSS/BLUE SHIELD | Admitting: Podiatry

## 2015-10-02 ENCOUNTER — Encounter: Payer: Self-pay | Admitting: Podiatry

## 2015-10-02 DIAGNOSIS — M722 Plantar fascial fibromatosis: Secondary | ICD-10-CM

## 2015-10-02 DIAGNOSIS — N912 Amenorrhea, unspecified: Secondary | ICD-10-CM | POA: Diagnosis not present

## 2015-10-02 NOTE — Progress Notes (Signed)
She presents today for bilateral plantar fasciitis. She states that she is approximately 50% improved and thinks one more injection will do the trick.  Objective: Vital signs are stable alert and oriented 3. Pulses are palpable. She has pain on palpation medial calcaneal tubercles but much less than previously noted.  Assessment: Plantar fasciitis slowly resolving 50% improved.  Plan: Reinject today bilateral heels with Kenalog and local anesthetic. Follow-up with her in 1 month if necessary.

## 2015-11-03 DIAGNOSIS — J0100 Acute maxillary sinusitis, unspecified: Secondary | ICD-10-CM | POA: Diagnosis not present

## 2015-11-22 DIAGNOSIS — Z01419 Encounter for gynecological examination (general) (routine) without abnormal findings: Secondary | ICD-10-CM | POA: Diagnosis not present

## 2015-11-22 DIAGNOSIS — Z6835 Body mass index (BMI) 35.0-35.9, adult: Secondary | ICD-10-CM | POA: Diagnosis not present

## 2016-01-29 DIAGNOSIS — N939 Abnormal uterine and vaginal bleeding, unspecified: Secondary | ICD-10-CM | POA: Diagnosis not present

## 2016-01-29 DIAGNOSIS — N920 Excessive and frequent menstruation with regular cycle: Secondary | ICD-10-CM | POA: Diagnosis not present

## 2016-03-06 DIAGNOSIS — N926 Irregular menstruation, unspecified: Secondary | ICD-10-CM | POA: Diagnosis not present

## 2016-03-07 DIAGNOSIS — Z319 Encounter for procreative management, unspecified: Secondary | ICD-10-CM | POA: Diagnosis not present

## 2016-04-13 DIAGNOSIS — N970 Female infertility associated with anovulation: Secondary | ICD-10-CM | POA: Diagnosis not present

## 2016-04-13 DIAGNOSIS — N926 Irregular menstruation, unspecified: Secondary | ICD-10-CM | POA: Diagnosis not present

## 2016-04-13 DIAGNOSIS — Z319 Encounter for procreative management, unspecified: Secondary | ICD-10-CM | POA: Diagnosis not present

## 2016-04-13 DIAGNOSIS — N978 Female infertility of other origin: Secondary | ICD-10-CM | POA: Diagnosis not present

## 2016-04-13 DIAGNOSIS — E288 Other ovarian dysfunction: Secondary | ICD-10-CM | POA: Diagnosis not present

## 2016-04-15 DIAGNOSIS — Z319 Encounter for procreative management, unspecified: Secondary | ICD-10-CM | POA: Diagnosis not present

## 2016-04-15 DIAGNOSIS — Z3141 Encounter for fertility testing: Secondary | ICD-10-CM | POA: Diagnosis not present

## 2016-04-15 DIAGNOSIS — N8500 Endometrial hyperplasia, unspecified: Secondary | ICD-10-CM | POA: Diagnosis not present

## 2016-04-15 DIAGNOSIS — Z113 Encounter for screening for infections with a predominantly sexual mode of transmission: Secondary | ICD-10-CM | POA: Diagnosis not present

## 2016-04-30 DIAGNOSIS — N978 Female infertility of other origin: Secondary | ICD-10-CM | POA: Diagnosis not present

## 2016-04-30 DIAGNOSIS — Z31430 Encounter of female for testing for genetic disease carrier status for procreative management: Secondary | ICD-10-CM | POA: Diagnosis not present

## 2016-04-30 DIAGNOSIS — N926 Irregular menstruation, unspecified: Secondary | ICD-10-CM | POA: Diagnosis not present

## 2016-04-30 DIAGNOSIS — N970 Female infertility associated with anovulation: Secondary | ICD-10-CM | POA: Diagnosis not present

## 2016-05-09 DIAGNOSIS — N926 Irregular menstruation, unspecified: Secondary | ICD-10-CM | POA: Diagnosis not present

## 2016-05-09 DIAGNOSIS — E288 Other ovarian dysfunction: Secondary | ICD-10-CM | POA: Diagnosis not present

## 2016-05-11 DIAGNOSIS — Z31440 Encounter of male for testing for genetic disease carrier status for procreative management: Secondary | ICD-10-CM | POA: Diagnosis not present

## 2016-05-11 DIAGNOSIS — N978 Female infertility of other origin: Secondary | ICD-10-CM | POA: Diagnosis not present

## 2016-05-11 DIAGNOSIS — N926 Irregular menstruation, unspecified: Secondary | ICD-10-CM | POA: Diagnosis not present

## 2016-05-11 DIAGNOSIS — N970 Female infertility associated with anovulation: Secondary | ICD-10-CM | POA: Diagnosis not present

## 2016-05-26 DIAGNOSIS — Z3200 Encounter for pregnancy test, result unknown: Secondary | ICD-10-CM | POA: Diagnosis not present

## 2016-05-28 DIAGNOSIS — Z3201 Encounter for pregnancy test, result positive: Secondary | ICD-10-CM | POA: Diagnosis not present

## 2016-06-01 DIAGNOSIS — H5213 Myopia, bilateral: Secondary | ICD-10-CM | POA: Diagnosis not present

## 2016-06-10 DIAGNOSIS — Z3200 Encounter for pregnancy test, result unknown: Secondary | ICD-10-CM | POA: Diagnosis not present

## 2016-06-18 ENCOUNTER — Other Ambulatory Visit: Payer: Self-pay | Admitting: Internal Medicine

## 2016-06-18 ENCOUNTER — Ambulatory Visit
Admission: RE | Admit: 2016-06-18 | Discharge: 2016-06-18 | Disposition: A | Discharge status: Home / Self Care | Payer: BLUE CROSS/BLUE SHIELD | Source: Ambulatory Visit | Attending: Internal Medicine | Admitting: Internal Medicine

## 2016-06-18 DIAGNOSIS — M7989 Other specified soft tissue disorders: Secondary | ICD-10-CM | POA: Diagnosis not present

## 2016-06-18 DIAGNOSIS — M79644 Pain in right finger(s): Secondary | ICD-10-CM

## 2016-06-19 DIAGNOSIS — O0900 Supervision of pregnancy with history of infertility, unspecified trimester: Secondary | ICD-10-CM | POA: Diagnosis not present

## 2016-06-30 DIAGNOSIS — Z3685 Encounter for antenatal screening for Streptococcus B: Secondary | ICD-10-CM | POA: Diagnosis not present

## 2016-06-30 DIAGNOSIS — Z3401 Encounter for supervision of normal first pregnancy, first trimester: Secondary | ICD-10-CM | POA: Diagnosis not present

## 2016-06-30 LAB — OB RESULTS CONSOLE ANTIBODY SCREEN: Antibody Screen: NEGATIVE

## 2016-06-30 LAB — OB RESULTS CONSOLE ABO/RH: RH TYPE: NEGATIVE

## 2016-06-30 LAB — OB RESULTS CONSOLE HIV ANTIBODY (ROUTINE TESTING): HIV: NONREACTIVE

## 2016-06-30 LAB — OB RESULTS CONSOLE RPR: RPR: NONREACTIVE

## 2016-06-30 LAB — OB RESULTS CONSOLE GC/CHLAMYDIA
Chlamydia: NEGATIVE
Gonorrhea: NEGATIVE

## 2016-06-30 LAB — OB RESULTS CONSOLE HEPATITIS B SURFACE ANTIGEN: Hepatitis B Surface Ag: NEGATIVE

## 2016-06-30 LAB — OB RESULTS CONSOLE RUBELLA ANTIBODY, IGM: Rubella: IMMUNE

## 2016-07-10 DIAGNOSIS — Z3A10 10 weeks gestation of pregnancy: Secondary | ICD-10-CM | POA: Diagnosis not present

## 2016-07-10 DIAGNOSIS — Z3400 Encounter for supervision of normal first pregnancy, unspecified trimester: Secondary | ICD-10-CM | POA: Diagnosis not present

## 2016-07-10 DIAGNOSIS — Z113 Encounter for screening for infections with a predominantly sexual mode of transmission: Secondary | ICD-10-CM | POA: Diagnosis not present

## 2016-07-22 DIAGNOSIS — Z36 Encounter for antenatal screening for chromosomal anomalies: Secondary | ICD-10-CM | POA: Diagnosis not present

## 2016-07-22 DIAGNOSIS — Z3491 Encounter for supervision of normal pregnancy, unspecified, first trimester: Secondary | ICD-10-CM | POA: Diagnosis not present

## 2016-07-22 DIAGNOSIS — Z3682 Encounter for antenatal screening for nuchal translucency: Secondary | ICD-10-CM | POA: Diagnosis not present

## 2016-07-23 IMAGING — CR DG CHEST 1V PORT
1 series · 1 of 1 positions shown · non-contrast
Comparison: None.

CLINICAL DATA: Subacute onset of mid sternal chest pain and
shortness of breath. Lightheadedness. Initial encounter.

EXAM:
PORTABLE CHEST - 1 VIEW

[AP]
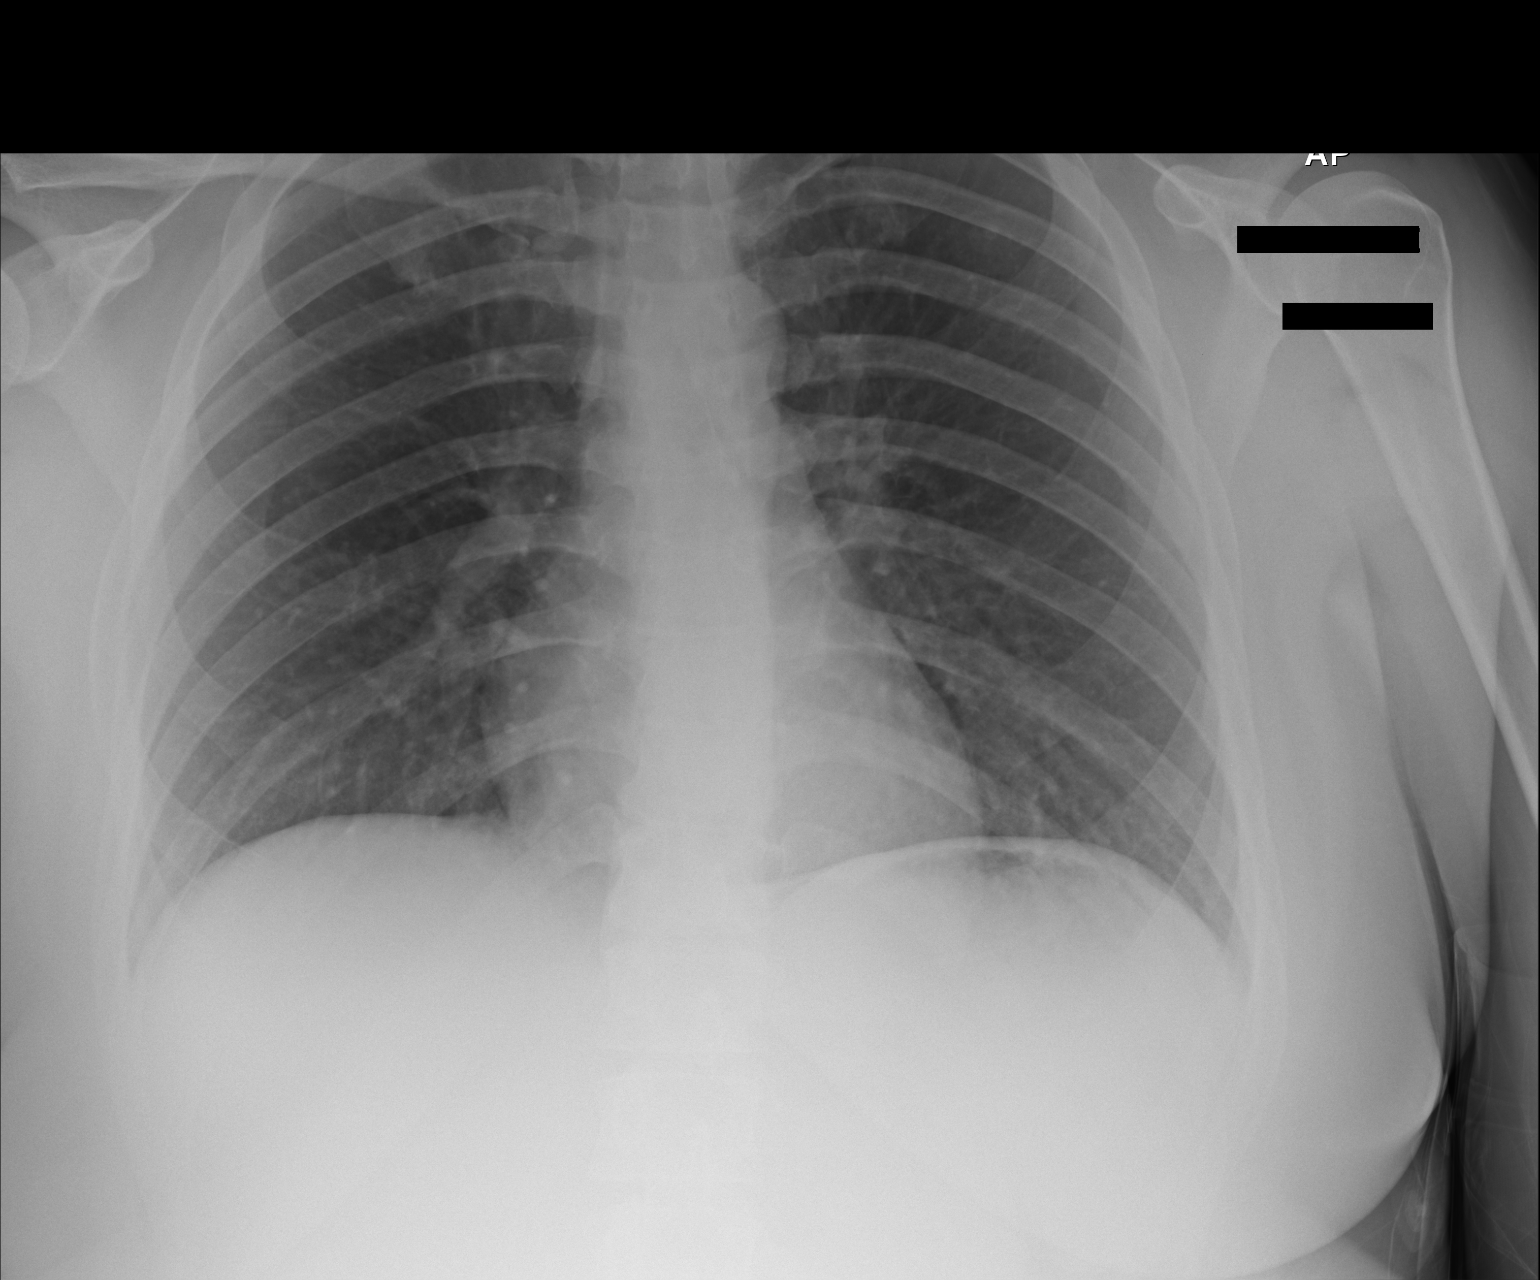

[1 of 1 positions shown; findings below may reference images not displayed]

FINDINGS: The lungs are well-aerated and clear. There is no evidence of focal
opacification, pleural effusion or pneumothorax.

The cardiomediastinal silhouette is within normal limits. No acute
osseous abnormalities are seen.
IMPRESSION: No acute cardiopulmonary process seen.

## 2016-09-09 DIAGNOSIS — Z3480 Encounter for supervision of other normal pregnancy, unspecified trimester: Secondary | ICD-10-CM | POA: Diagnosis not present

## 2016-09-09 DIAGNOSIS — Z3A19 19 weeks gestation of pregnancy: Secondary | ICD-10-CM | POA: Diagnosis not present

## 2016-09-09 DIAGNOSIS — Z363 Encounter for antenatal screening for malformations: Secondary | ICD-10-CM | POA: Diagnosis not present

## 2016-09-21 ENCOUNTER — Ambulatory Visit (INDEPENDENT_AMBULATORY_CARE_PROVIDER_SITE_OTHER): Payer: BLUE CROSS/BLUE SHIELD | Admitting: Podiatry

## 2016-09-21 ENCOUNTER — Encounter: Payer: Self-pay | Admitting: Podiatry

## 2016-09-21 DIAGNOSIS — M722 Plantar fascial fibromatosis: Secondary | ICD-10-CM | POA: Diagnosis not present

## 2016-09-21 NOTE — Progress Notes (Signed)
She presents today for follow-up of her plantar fasciitis she states that she inserted develop forefoot pain as well. She states that she is [redacted] weeks pregnant. She is on the state that her obstetrician states that we can give her injections if we do if we wanted to.  Objective: Vital signs are stable she is alert and oriented 3. She has pain on palpation medial calcaneal heels and forefoot bilateral.  Assessment: Compensatory capsulitis forefoot associated plantar fasciitis.  Plan: We made her a new pair of orthotics orthotics today no injections and no x-rays were taken.

## 2016-10-01 DIAGNOSIS — O358XX9 Maternal care for other (suspected) fetal abnormality and damage, other fetus: Secondary | ICD-10-CM | POA: Diagnosis not present

## 2016-10-06 DIAGNOSIS — Z3A22 22 weeks gestation of pregnancy: Secondary | ICD-10-CM | POA: Diagnosis not present

## 2016-10-06 DIAGNOSIS — Z362 Encounter for other antenatal screening follow-up: Secondary | ICD-10-CM | POA: Diagnosis not present

## 2016-10-19 DIAGNOSIS — Z3483 Encounter for supervision of other normal pregnancy, third trimester: Secondary | ICD-10-CM | POA: Diagnosis not present

## 2016-10-19 DIAGNOSIS — Z3482 Encounter for supervision of other normal pregnancy, second trimester: Secondary | ICD-10-CM | POA: Diagnosis not present

## 2016-10-21 ENCOUNTER — Other Ambulatory Visit: Payer: BLUE CROSS/BLUE SHIELD | Admitting: Orthotics

## 2016-11-03 DIAGNOSIS — Z3A26 26 weeks gestation of pregnancy: Secondary | ICD-10-CM | POA: Diagnosis not present

## 2016-11-03 DIAGNOSIS — R002 Palpitations: Secondary | ICD-10-CM | POA: Diagnosis not present

## 2016-11-03 DIAGNOSIS — Z3480 Encounter for supervision of other normal pregnancy, unspecified trimester: Secondary | ICD-10-CM | POA: Diagnosis not present

## 2016-11-03 DIAGNOSIS — O360920 Maternal care for other rhesus isoimmunization, second trimester, not applicable or unspecified: Secondary | ICD-10-CM | POA: Diagnosis not present

## 2016-11-10 ENCOUNTER — Ambulatory Visit (INDEPENDENT_AMBULATORY_CARE_PROVIDER_SITE_OTHER): Payer: BLUE CROSS/BLUE SHIELD | Admitting: Cardiology

## 2016-11-10 ENCOUNTER — Encounter: Payer: Self-pay | Admitting: Cardiology

## 2016-11-10 VITALS — BP 140/54 | HR 92 | Wt 254.1 lb

## 2016-11-10 DIAGNOSIS — R002 Palpitations: Secondary | ICD-10-CM | POA: Insufficient documentation

## 2016-11-10 NOTE — Progress Notes (Signed)
Cardiology Office Note:    Date:  11/10/2016   ID:  Cynthia English, DOB 08-07-1985, MRN [id number]  PCP:  Renford Dills, MD  Cardiologist:  Garwin Brothers, MD   Referring MD: Renford Dills, MD    ASSESSMENT:    1. Palpitations    PLAN:    In order of problems listed above:  1. I reassured the patient about my findings. Her blood pressure stable. She tells me that her much blood pressure is much more better at home. Her examination is unremarkable. She is having blood work including thyroid testing by her primary care physician and we will await those results. Echocardiogram will be done to assess murmur heard on auscultation. I feel it is a benign murmur of pregnancy. She will undergo 48 hour Holter monitoring to assess her palpitations. 2. Patient will be seen in follow-up appointment in 6 months or earlier if the patient has any concerns.    Medication Adjustments/Labs and Tests Ordered: Current medicines are reviewed at length with the patient today.  Concerns regarding medicines are outlined above.  Orders Placed This Encounter  Procedures  . Holter monitor - 48 hour  . EKG 12-Lead  . ECHOCARDIOGRAM COMPLETE   No orders of the defined types were placed in this encounter.    History of Present Illness:    Cynthia English is a 31 y.o. female who is being seen today for the evaluation of palpitations at the request of Renford Dills, MD.  Patient is a pleasant 31 year old female. She tells me that she is [redacted] weeks pregnant with IVF. She mentions to me that she has palpitations on and off. The increased significantly. Now it has gotten much better. This has been without any intervention. At the time of my evaluation she is alert awake oriented cheerful and in no distress. She is never passed out or had any chest pain. She is overall in good health and denies any history of hypertension diabetes mellitus or any other significant comorbidities.  Past Medical History:    Diagnosis Date  . Kidney stones     Past Surgical History:  Procedure Laterality Date  . FOOT SURGERY Bilateral     Current Medications: Current Meds  Medication Sig  . Docosahexaenoic Acid (PRENATAL DHA PO) Take 200 mg by mouth daily.     Allergies:   Percocet [oxycodone-acetaminophen] and Prednisone   Social History   Social History  . Marital status: Married    Spouse name: N/A  . Number of children: N/A  . Years of education: N/A   Social History Main Topics  . Smoking status: Never Smoker  . Smokeless tobacco: Never Used  . Alcohol use No  . Drug use: No  . Sexual activity: Not Asked   Other Topics Concern  . None   Social History Narrative  . None     Family History: The patient's family history includes Arrhythmia in her father; Supraventricular tachycardia in her father.  ROS:   Please see the history of present illness.    All other systems reviewed and are negative.  EKGs/Labs/Other Studies Reviewed:    The following studies were reviewed today:  I reviewed lab work and EKGs extensively and discussed the findings with the patient. EKG reveals sinus rhythmand nonspecific ST-T changes.   Recent Labs: No results found for requested labs within last 8760 hours.  Recent Lipid Panel No results found for: CHOL, TRIG, HDL, CHOLHDL, VLDL, LDLCALC, LDLDIRECT  Physical Exam:  VS:  BP (!) 140/54   Pulse 92   Wt 254 lb 1.9 oz (115.3 kg)   SpO2 99%   BMI 39.80 kg/m     Wt Readings from Last 3 Encounters:  11/10/16 254 lb 1.9 oz (115.3 kg)  07/30/14 216 lb 3.2 oz (98.1 kg)  04/05/14 215 lb (97.5 kg)     GEN: Patient is in no acute distress HEENT: Normal NECK: No JVD; No carotid bruits LYMPHATICS: No lymphadenopathy CARDIAC: S1 S2 regular, 2/6 systolic murmur at the apex. RESPIRATORY:  Clear to auscultation without rales, wheezing or rhonchi  ABDOMEN: Soft, non-tender, non-distended MUSCULOSKELETAL:  No edema; No deformity  SKIN: Warm and  dry NEUROLOGIC:  Alert and oriented x 3 PSYCHIATRIC:  Normal affect    Signed, Garwin Brothers, MD  11/10/2016 11:23 AM    [facility]

## 2016-11-10 NOTE — Patient Instructions (Addendum)
Medication Instructions:  Your physician recommends that you continue on your current medications as directed. Please refer to the Current Medication list given to you today.   Labwork: None  Testing/Procedures: Your physician has requested that you have an echocardiogram. Echocardiography is a painless test that uses sound waves to create images of your heart. It provides your doctor with information about the size and shape of your heart and how well your heart's chambers and valves are working. This procedure takes approximately one hour. There are no restrictions for this procedure.  Your physician has recommended that you wear a holter monitor. Holter monitors are medical devices that record the heart's electrical activity. Doctors most often use these monitors to diagnose arrhythmias. Arrhythmias are problems with the speed or rhythm of the heartbeat. The monitor is a small, portable device. You can wear one while you do your normal daily activities. This is usually used to diagnose what is causing palpitations/syncope (passing out).    Follow-Up: 1 month  Any Other Special Instructions Will Be Listed Below (If Applicable).     If you need a refill on your cardiac medications before your next appointment, please call your pharmacy.   Holter Monitoring A Holter monitor is a small device that is used to detect abnormal heart rhythms. It clips to your clothing and is connected by wires to flat, sticky disks (electrodes) that attach to your chest. It is worn continuously for 24-48 hours. Follow these instructions at home:  Wear your Holter monitor at all times, even while exercising and sleeping, for as long as directed by your health care provider.  Make sure that the Holter monitor is safely clipped to your clothing or close to your body as recommended by your health care provider.  Do not get the monitor or wires wet.  Do not put body lotion or moisturizer on your  chest.  Keep your skin clean.  Keep a diary of your daily activities, such as walking and doing chores. If you feel that your heartbeat is abnormal or that your heart is fluttering or skipping a beat: ? Record what you are doing when it happens. ? Record what time of day the symptoms occur.  Return your Holter monitor as directed by your health care provider.  Keep all follow-up visits as directed by your health care provider. This is important. Get help right away if:  You feel lightheaded or you faint.  You have trouble breathing.  You feel pain in your chest, upper arm, or jaw.  You feel sick to your stomach and your skin is pale, cool, or damp.  You heartbeat feels unusual or abnormal. This information is not intended to replace advice given to you by your health care provider. Make sure you discuss any questions you have with your health care provider. Document Released: 10/11/2003 Document Revised: 06/20/2015 Document Reviewed: 08/21/2013 Elsevier Interactive Patient Education  2018 [employer]. Echocardiogram An echocardiogram, or echocardiography, uses sound waves (ultrasound) to produce an image of your heart. The echocardiogram is simple, painless, obtained within a short period of time, and offers valuable information to your health care provider. The images from an echocardiogram can provide information such as:  Evidence of coronary artery disease (CAD).  Heart size.  Heart muscle function.  Heart valve function.  Aneurysm detection.  Evidence of a past heart attack.  Fluid buildup around the heart.  Heart muscle thickening.  Assess heart valve function.  Tell a health care provider about:  Any  allergies you have.  All medicines you are taking, including vitamins, herbs, eye drops, creams, and over-the-counter medicines.  Any problems you or family members have had with anesthetic medicines.  Any blood disorders you have.  Any surgeries you have  had.  Any medical conditions you have.  Whether you are pregnant or may be pregnant. What happens before the procedure? No special preparation is needed. Eat and drink normally. What happens during the procedure?  In order to produce an image of your heart, gel will be applied to your chest and a wand-like tool (transducer) will be moved over your chest. The gel will help transmit the sound waves from the transducer. The sound waves will harmlessly bounce off your heart to allow the heart images to be captured in real-time motion. These images will then be recorded.  You may need an IV to receive a medicine that improves the quality of the pictures. What happens after the procedure? You may return to your normal schedule including diet, activities, and medicines, unless your health care provider tells you otherwise. This information is not intended to replace advice given to you by your health care provider. Make sure you discuss any questions you have with your health care provider. Document Released: 01/10/2000 Document Revised: 08/31/2015 Document Reviewed: 09/19/2012 Elsevier Interactive Patient Education  2017 [employer].

## 2016-11-19 DIAGNOSIS — Z23 Encounter for immunization: Secondary | ICD-10-CM | POA: Diagnosis not present

## 2016-12-04 DIAGNOSIS — N760 Acute vaginitis: Secondary | ICD-10-CM | POA: Diagnosis not present

## 2016-12-04 DIAGNOSIS — O26853 Spotting complicating pregnancy, third trimester: Secondary | ICD-10-CM | POA: Diagnosis not present

## 2016-12-04 DIAGNOSIS — O368130 Decreased fetal movements, third trimester, not applicable or unspecified: Secondary | ICD-10-CM | POA: Diagnosis not present

## 2016-12-04 DIAGNOSIS — Z3A31 31 weeks gestation of pregnancy: Secondary | ICD-10-CM | POA: Diagnosis not present

## 2016-12-04 DIAGNOSIS — O26893 Other specified pregnancy related conditions, third trimester: Secondary | ICD-10-CM | POA: Diagnosis not present

## 2016-12-09 ENCOUNTER — Telehealth: Payer: Self-pay | Admitting: Cardiology

## 2016-12-09 ENCOUNTER — Ambulatory Visit: Payer: BLUE CROSS/BLUE SHIELD

## 2016-12-09 ENCOUNTER — Ambulatory Visit (HOSPITAL_BASED_OUTPATIENT_CLINIC_OR_DEPARTMENT_OTHER): Payer: BLUE CROSS/BLUE SHIELD

## 2016-12-09 DIAGNOSIS — R002 Palpitations: Secondary | ICD-10-CM

## 2016-12-09 NOTE — Telephone Encounter (Signed)
Pt has echo and monitor today but she is going to have to pay a lot out of pocket for echo so wants to know if it is ok to just get the monitor only

## 2016-12-09 NOTE — Telephone Encounter (Signed)
Informed patient that the echo is a recommendation from Dr. Tomie Chinaevankar; however, it is ultimately her decision. The patient stated that she would like to see what the monitor shows and if the echo is needed after then she would proceed.

## 2016-12-10 ENCOUNTER — Ambulatory Visit (HOSPITAL_BASED_OUTPATIENT_CLINIC_OR_DEPARTMENT_OTHER): Payer: BLUE CROSS/BLUE SHIELD

## 2016-12-11 ENCOUNTER — Other Ambulatory Visit: Payer: Self-pay

## 2016-12-16 ENCOUNTER — Ambulatory Visit: Payer: BLUE CROSS/BLUE SHIELD | Admitting: Cardiology

## 2016-12-16 ENCOUNTER — Encounter: Payer: Self-pay | Admitting: Cardiology

## 2016-12-16 VITALS — BP 110/64 | HR 106 | Ht 67.0 in | Wt 261.8 lb

## 2016-12-16 DIAGNOSIS — R002 Palpitations: Secondary | ICD-10-CM

## 2016-12-16 NOTE — Progress Notes (Signed)
Cardiology Office Note:    Date:  12/16/2016   ID:  Cynthia English, DOB 1985/05/12, MRN [id number]  PCP:  Renford DillsPolite, Ronald, MD  Cardiologist:  Garwin Brothersajan R Revankar, MD   Referring MD: Renford DillsPolite, Ronald, MD    ASSESSMENT:    1. Palpitations    PLAN:    In order of problems listed above:  1. I reassured the patient about her palpitations.  They have gotten better.  She has tried to keep herself well-hydrated.  I tried to discuss with her about echocardiogram and she is not keen on it at this time and I respect her wishes.  Her palpitations are better.  I told her that if they worsen I could suggest a beta-blocker to her in a low dose.  However I also cautioned her about this since her blood pressure is very borderline and she vocalized understanding.  She will be seen in follow-up appointment on a as necessary basis.   Medication Adjustments/Labs and Tests Ordered: Current medicines are reviewed at length with the patient today.  Concerns regarding medicines are outlined above.  No orders of the defined types were placed in this encounter.  No orders of the defined types were placed in this encounter.    Chief Complaint  Patient presents with  . Follow-up    Still having Palpitations more than before " good and bad days"      History of Present Illness:    Cynthia English is a 31 y.o. female.  The patient is the third trimester of pregnancy.  She denies any problems at this time except palpitations.  She was evaluated for same reason.  Chest pain orthopnea or PND.  No passing out spells.  She mentions to me that her spells of palpitations are better to some extent.  Her Holter monitor has come out to be unremarkable.  She has declined to get evaluated with an echocardiogram for financial reasons.  Past Medical History:  Diagnosis Date  . Kidney stones     Past Surgical History:  Procedure Laterality Date  . FOOT SURGERY Bilateral     Current Medications: No outpatient  medications have been marked as taking for the 12/16/16 encounter (Office Visit) with Revankar, Aundra Dubinajan R, MD.     Allergies:   Percocet [oxycodone-acetaminophen] and Prednisone   Social History   Socioeconomic History  . Marital status: Married    Spouse name: None  . Number of children: None  . Years of education: None  . Highest education level: None  Social Needs  . Financial resource strain: None  . Food insecurity - worry: None  . Food insecurity - inability: None  . Transportation needs - medical: None  . Transportation needs - non-medical: None  Occupational History  . None  Tobacco Use  . Smoking status: Never Smoker  . Smokeless tobacco: Never Used  Substance and Sexual Activity  . Alcohol use: No  . Drug use: No  . Sexual activity: None  Other Topics Concern  . None  Social History Narrative  . None     Family History: The patient's family history includes Arrhythmia in her father; Supraventricular tachycardia in her father.  ROS:   Please see the history of present illness.    All other systems reviewed and are negative.  EKGs/Labs/Other Studies Reviewed:    The following studies were reviewed today: I discussed my findings with the patient extensively.  Holter monitoring report was discussed it was unremarkable and I reassured  the patient.   Recent Labs: No results found for requested labs within last 8760 hours.  Recent Lipid Panel No results found for: CHOL, TRIG, HDL, CHOLHDL, VLDL, LDLCALC, LDLDIRECT  Physical Exam:    VS:  BP 110/64   Pulse (!) 106   Ht 5\' 7"  (1.702 m)   Wt 261 lb 12.8 oz (118.8 kg)   SpO2 96%   BMI 41.00 kg/m     Wt Readings from Last 3 Encounters:  12/16/16 261 lb 12.8 oz (118.8 kg)  11/10/16 254 lb 1.9 oz (115.3 kg)  07/30/14 216 lb 3.2 oz (98.1 kg)     GEN: Patient is in no acute distress HEENT: Normal NECK: No JVD; No carotid bruits LYMPHATICS: No lymphadenopathy CARDIAC: Hear sounds regular, 2/6 systolic  murmur at the apex. RESPIRATORY:  Clear to auscultation without rales, wheezing or rhonchi  ABDOMEN: Soft, non-tender, non-distended MUSCULOSKELETAL:  No edema; No deformity  SKIN: Warm and dry NEUROLOGIC:  Alert and oriented x 3 PSYCHIATRIC:  Normal affect   Signed, Garwin Brothersajan R Revankar, MD  12/16/2016 9:01 AM    [facility]

## 2016-12-16 NOTE — Patient Instructions (Signed)
Medication Instructions:  Your physician recommends that you continue on your current medications as directed. Please refer to the Current Medication list given to you today.   Labwork: None  Testing/Procedures: None  Follow-Up: Your physician recommends that you schedule a follow-up appointment in: as needed   Any Other Special Instructions Will Be Listed Below (If Applicable).     If you need a refill on your cardiac medications before your next appointment, please call your pharmacy.   [facility]  Judy Goodenow A, RN, BSN  

## 2017-01-05 DIAGNOSIS — Z3685 Encounter for antenatal screening for Streptococcus B: Secondary | ICD-10-CM | POA: Diagnosis not present

## 2017-01-05 DIAGNOSIS — Z3480 Encounter for supervision of other normal pregnancy, unspecified trimester: Secondary | ICD-10-CM | POA: Diagnosis not present

## 2017-01-05 LAB — OB RESULTS CONSOLE GBS: GBS: NEGATIVE

## 2017-01-27 ENCOUNTER — Encounter (HOSPITAL_COMMUNITY): Payer: Self-pay | Admitting: *Deleted

## 2017-01-27 ENCOUNTER — Telehealth (HOSPITAL_COMMUNITY): Payer: Self-pay | Admitting: *Deleted

## 2017-01-27 NOTE — Telephone Encounter (Signed)
Preadmission screen  

## 2017-01-28 ENCOUNTER — Encounter (HOSPITAL_COMMUNITY): Payer: Self-pay | Admitting: *Deleted

## 2017-01-28 ENCOUNTER — Telehealth (HOSPITAL_COMMUNITY): Payer: Self-pay | Admitting: *Deleted

## 2017-01-28 NOTE — Telephone Encounter (Signed)
Preadmission screen  

## 2017-02-04 ENCOUNTER — Inpatient Hospital Stay (HOSPITAL_COMMUNITY): Payer: BLUE CROSS/BLUE SHIELD | Admitting: Anesthesiology

## 2017-02-04 ENCOUNTER — Inpatient Hospital Stay (HOSPITAL_COMMUNITY)
Admission: RE | Admit: 2017-02-04 | Discharge: 2017-02-08 | DRG: 788 | Disposition: A | Discharge status: Home / Self Care | Payer: BLUE CROSS/BLUE SHIELD | Source: Ambulatory Visit | Attending: Obstetrics & Gynecology | Admitting: Obstetrics & Gynecology

## 2017-02-04 ENCOUNTER — Encounter (HOSPITAL_COMMUNITY): Payer: Self-pay

## 2017-02-04 DIAGNOSIS — Z23 Encounter for immunization: Secondary | ICD-10-CM | POA: Diagnosis not present

## 2017-02-04 DIAGNOSIS — Z6791 Unspecified blood type, Rh negative: Secondary | ICD-10-CM

## 2017-02-04 DIAGNOSIS — Z412 Encounter for routine and ritual male circumcision: Secondary | ICD-10-CM | POA: Diagnosis not present

## 2017-02-04 DIAGNOSIS — O26893 Other specified pregnancy related conditions, third trimester: Principal | ICD-10-CM | POA: Diagnosis present

## 2017-02-04 DIAGNOSIS — D649 Anemia, unspecified: Secondary | ICD-10-CM | POA: Diagnosis present

## 2017-02-04 DIAGNOSIS — O9902 Anemia complicating childbirth: Secondary | ICD-10-CM | POA: Diagnosis not present

## 2017-02-04 DIAGNOSIS — Z98891 History of uterine scar from previous surgery: Secondary | ICD-10-CM

## 2017-02-04 DIAGNOSIS — Z3490 Encounter for supervision of normal pregnancy, unspecified, unspecified trimester: Secondary | ICD-10-CM

## 2017-02-04 DIAGNOSIS — Z3A40 40 weeks gestation of pregnancy: Secondary | ICD-10-CM

## 2017-02-04 DIAGNOSIS — O99214 Obesity complicating childbirth: Secondary | ICD-10-CM | POA: Diagnosis not present

## 2017-02-04 LAB — CBC
HEMATOCRIT: 37.1 % (ref 36.0–46.0)
HEMOGLOBIN: 12.5 g/dL (ref 12.0–15.0)
MCH: 31.5 pg (ref 26.0–34.0)
MCHC: 33.7 g/dL (ref 30.0–36.0)
MCV: 93.5 fL (ref 78.0–100.0)
Platelets: 359 10*3/uL (ref 150–400)
RBC: 3.97 MIL/uL (ref 3.87–5.11)
RDW: 13.5 % (ref 11.5–15.5)
WBC: 11.5 10*3/uL — AB (ref 4.0–10.5)

## 2017-02-04 LAB — TYPE AND SCREEN
ABO/RH(D): O NEG
Antibody Screen: NEGATIVE

## 2017-02-04 LAB — ABO/RH: ABO/RH(D): O NEG

## 2017-02-04 LAB — RPR: RPR Ser Ql: NONREACTIVE

## 2017-02-04 MED ORDER — LACTATED RINGERS IV SOLN
INTRAVENOUS | Status: DC
  Administered 2017-02-04 (×3): via INTRAVENOUS

## 2017-02-04 MED ORDER — LIDOCAINE HCL (PF) 1 % IJ SOLN
30.0000 mL | INTRAMUSCULAR | Status: DC | PRN
  Filled 2017-02-04: qty 30

## 2017-02-04 MED ORDER — FENTANYL CITRATE (PF) 100 MCG/2ML IJ SOLN
50.0000 ug | INTRAMUSCULAR | Status: DC | PRN
  Administered 2017-02-04 (×3): 100 ug via INTRAVENOUS
  Filled 2017-02-04 (×3): qty 2

## 2017-02-04 MED ORDER — EPHEDRINE 5 MG/ML INJ: 10.0000 mg | INTRAVENOUS | Status: DC | PRN

## 2017-02-04 MED ORDER — OXYTOCIN 40 UNITS IN LACTATED RINGERS INFUSION - SIMPLE MED
2.5000 [IU]/h | INTRAVENOUS | Status: DC
  Filled 2017-02-04: qty 1000

## 2017-02-04 MED ORDER — PHENYLEPHRINE 40 MCG/ML (10ML) SYRINGE FOR IV PUSH (FOR BLOOD PRESSURE SUPPORT)
80.0000 ug | PREFILLED_SYRINGE | INTRAVENOUS | Status: DC | PRN
  Filled 2017-02-04 (×2): qty 10

## 2017-02-04 MED ORDER — LIDOCAINE HCL (PF) 1 % IJ SOLN
INTRAMUSCULAR | Status: DC | PRN
  Administered 2017-02-04: 13 mL via EPIDURAL

## 2017-02-04 MED ORDER — FENTANYL 2.5 MCG/ML BUPIVACAINE 1/10 % EPIDURAL INFUSION (WH - ANES)
14.0000 mL/h | INTRAMUSCULAR | Status: DC | PRN
  Administered 2017-02-04 (×3): 14 mL/h via EPIDURAL
  Filled 2017-02-04 (×3): qty 100

## 2017-02-04 MED ORDER — ACETAMINOPHEN 325 MG PO TABS: 650.0000 mg | ORAL_TABLET | ORAL | Status: DC | PRN

## 2017-02-04 MED ORDER — LACTATED RINGERS IV SOLN
500.0000 mL | INTRAVENOUS | Status: DC | PRN
  Administered 2017-02-04: 500 mL via INTRAVENOUS
  Administered 2017-02-04 (×2): 250 mL via INTRAVENOUS

## 2017-02-04 MED ORDER — OXYTOCIN 40 UNITS IN LACTATED RINGERS INFUSION - SIMPLE MED
1.0000 m[IU]/min | INTRAVENOUS | Status: DC
  Administered 2017-02-04 – 2017-02-05 (×3): 2 m[IU]/min via INTRAVENOUS

## 2017-02-04 MED ORDER — MISOPROSTOL 25 MCG QUARTER TABLET
25.0000 ug | ORAL_TABLET | ORAL | Status: DC | PRN
  Administered 2017-02-04 (×2): 25 ug via VAGINAL
  Filled 2017-02-04 (×2): qty 1

## 2017-02-04 MED ORDER — ONDANSETRON HCL 4 MG/2ML IJ SOLN
4.0000 mg | Freq: Four times a day (QID) | INTRAMUSCULAR | Status: DC | PRN
  Administered 2017-02-04 (×2): 4 mg via INTRAVENOUS
  Filled 2017-02-04 (×2): qty 2

## 2017-02-04 MED ORDER — ZOLPIDEM TARTRATE 5 MG PO TABS: 5.0000 mg | ORAL_TABLET | Freq: Every evening | ORAL | Status: DC | PRN

## 2017-02-04 MED ORDER — PHENYLEPHRINE 40 MCG/ML (10ML) SYRINGE FOR IV PUSH (FOR BLOOD PRESSURE SUPPORT): 80.0000 ug | PREFILLED_SYRINGE | INTRAVENOUS | Status: DC | PRN

## 2017-02-04 MED ORDER — FLEET ENEMA 7-19 GM/118ML RE ENEM: 1.0000 | ENEMA | RECTAL | Status: DC | PRN

## 2017-02-04 MED ORDER — DIPHENHYDRAMINE HCL 50 MG/ML IJ SOLN: 12.5000 mg | INTRAMUSCULAR | Status: DC | PRN

## 2017-02-04 MED ORDER — TERBUTALINE SULFATE 1 MG/ML IJ SOLN
0.2500 mg | Freq: Once | INTRAMUSCULAR | Status: DC | PRN
  Filled 2017-02-04: qty 1

## 2017-02-04 MED ORDER — LACTATED RINGERS IV SOLN
500.0000 mL | Freq: Once | INTRAVENOUS | Status: AC
  Administered 2017-02-04: 500 mL via INTRAVENOUS

## 2017-02-04 MED ORDER — SOD CITRATE-CITRIC ACID 500-334 MG/5ML PO SOLN
30.0000 mL | ORAL | Status: DC | PRN
  Administered 2017-02-05: 30 mL via ORAL
  Filled 2017-02-04: qty 15

## 2017-02-04 MED ORDER — OXYTOCIN BOLUS FROM INFUSION: 500.0000 mL | Freq: Once | INTRAVENOUS | Status: DC

## 2017-02-04 MED ORDER — TERBUTALINE SULFATE 1 MG/ML IJ SOLN
0.2500 mg | Freq: Once | INTRAMUSCULAR | Status: AC | PRN
  Administered 2017-02-04: 0.25 mg via SUBCUTANEOUS

## 2017-02-04 NOTE — Anesthesia Preprocedure Evaluation (Signed)
Anesthesia Evaluation  Patient identified by MRN, date of birth, ID band Patient awake    Reviewed: Allergy & Precautions, NPO status , Patient's Chart, lab work & pertinent test results  Airway Mallampati: II  TM Distance: >3 FB Neck ROM: Full    Dental no notable dental hx.    Pulmonary neg pulmonary ROS,    Pulmonary exam normal breath sounds clear to auscultation       Cardiovascular negative cardio ROS Normal cardiovascular exam Rhythm:Regular Rate:Normal     Neuro/Psych negative neurological ROS  negative psych ROS   GI/Hepatic negative GI ROS, Neg liver ROS,   Endo/Other  negative endocrine ROSMorbid obesity  Renal/GU negative Renal ROS  negative genitourinary   Musculoskeletal negative musculoskeletal ROS (+)   Abdominal   Peds negative pediatric ROS (+)  Hematology negative hematology ROS (+)   Anesthesia Other Findings   Reproductive/Obstetrics negative OB ROS (+) Pregnancy                             Anesthesia Physical Anesthesia Plan  ASA: III  Anesthesia Plan: Epidural   Post-op Pain Management:    Induction:   PONV Risk Score and Plan:   Airway Management Planned:   Additional Equipment:   Intra-op Plan:   Post-operative Plan:   Informed Consent:   Plan Discussed with:   Anesthesia Plan Comments:         Anesthesia Quick Evaluation  

## 2017-02-04 NOTE — Progress Notes (Signed)
Patient comfortable with epidural.  Pitocin started.  Last SVE ballotable.  Will recheck in 2 h and AROM if able.   Mitchel HonourMegan Kiyomi Pallo, DO

## 2017-02-04 NOTE — Anesthesia Pain Management Evaluation Note (Addendum)
  CRNA Pain Management Visit Note  Patient: Cynthia English, 32 y.o., female  "Hello I am a [facility] the anesthesia [facility]. We have an anesthesia team available at all times to provide care throughout the hospital, including epidural management and anesthesia for C-section. I don't know your plan for the delivery whether it a natural birth, water birth, IV sedation, nitrous supplementation, doula or epidural, but we want to meet your pain goals."   1.Was your pain managed to your expectations on prior hospitalizations?   No prior hospitalizations  2.What is your expectation for pain management during this hospitalization?     Epidural  3.How can we help you reach that goal? Place epidural @ pain goal.  Record the patient's initial score and the patient's pain goal.   Pain: 5  Pain Goal: 7 [facility] wants you to be able to say your pain was always managed very well.  Cynthia English 02/04/2017

## 2017-02-04 NOTE — Anesthesia Procedure Notes (Signed)
Epidural Patient location during procedure: OB Start time: 02/04/2017 11:06 AM End time: 02/04/2017 11:24 AM  Staffing Anesthesiologist: Lowella CurbMiller, Arabell Neria Ray, MD Performed: anesthesiologist   Preanesthetic Checklist Completed: patient identified, site marked, surgical consent, pre-op evaluation, timeout performed, IV checked, risks and benefits discussed and monitors and equipment checked  Epidural Patient position: sitting Prep: ChloraPrep Patient monitoring: heart rate, cardiac monitor, continuous pulse ox and blood pressure Approach: midline Location: L2-L3 Injection technique: LOR saline  Needle:  Needle type: Tuohy  Needle gauge: 17 G Needle length: 9 cm Needle insertion depth: 7 cm Catheter type: closed end flexible Catheter size: 20 Guage Catheter at skin depth: 11 cm Test dose: negative  Assessment Events: blood not aspirated, injection not painful, no injection resistance, negative IV test and no paresthesia  Additional Notes Reason for block:procedure for pain

## 2017-02-04 NOTE — H&P (Addendum)
Cynthia English is a 32 y.o. female presenting for IOL at term.  Patient has IVF pregnancy with normal fetal ECHO.  Rh negative.  GBS negative.  Patient has received 2 misoprostol overnight; last at 0555.  She is feeling CTX regularly. Active FM and no LOF.  OB History    Gravida Para Term Preterm AB Living   2       1     SAB TAB Ectopic Multiple Live Births   1             Past Medical History:  Diagnosis Date  . Kidney stones   . Newborn product of in vitro fertilization (IVF) pregnancy    Past Surgical History:  Procedure Laterality Date  . DILATION AND CURETTAGE OF UTERUS    . FOOT SURGERY Bilateral   . PLANTAR FASCIA RELEASE    . toe rotation  2004   Family History: family history includes Arrhythmia in her father; Breast cancer in her paternal aunt and paternal grandmother; Colon cancer in her paternal grandfather; Diabetes in her paternal aunt; Hypertension in her father, maternal grandmother, and paternal grandmother; Supraventricular tachycardia in her father. Social History:  reports that  has never smoked. she has never used smokeless tobacco. She reports that she does not drink alcohol or use drugs.     Maternal Diabetes: No Genetic Screening: Normal Maternal Ultrasounds/Referrals: Normal Fetal Ultrasounds or other Referrals:  Fetal echo Maternal Substance Abuse:  No Significant Maternal Medications:  None Significant Maternal Lab Results:  Lab values include: Group B Strep negative, Rh negative Other Comments:  None  ROS Maternal Medical History:  Contractions: Onset was 3-5 hours ago.   Frequency: irregular.   Perceived severity is mild.    Fetal activity: Perceived fetal activity is normal.   Last perceived fetal movement was within the past hour.    Prenatal complications: no prenatal complications Prenatal Complications - Diabetes: none.    Dilation: 2.5 Effacement (%): 60 Station: -3 Exam by:: Clare CharonKristin Hutchison, RN Blood pressure 127/75, pulse 87,  temperature 97.9 F (36.6 C), temperature source Oral, resp. rate 18, height 5\' 7"  (1.702 m), weight 267 lb 12.8 oz (121.5 kg). Maternal Exam:  Uterine Assessment: Contraction strength is mild.  Contraction frequency is regular.   Abdomen: Patient reports no abdominal tenderness. Fundal height is c/w dates.   Estimated fetal weight is 8#.       Physical Exam  Constitutional: She is oriented to person, place, and time. She appears well-developed and well-nourished.  GI: Soft. There is no rebound and no guarding.  Neurological: She is alert and oriented to person, place, and time.  Skin: Skin is warm and dry.  Psychiatric: She has a normal mood and affect. Her behavior is normal.    Prenatal labs: ABO, Rh: --/--/O NEG (01/10 0130) Antibody: NEG (01/10 0122) Rubella: Immune (06/05 0000) RPR: Nonreactive (06/05 0000)  HBsAg: Negative (06/05 0000)  HIV: Non-reactive (06/05 0000)  GBS: Negative (12/11 0000)   Assessment/Plan: 31yo G2P0010 at 40w for IOL -Recheck at 10; consider pitocin vs AROM -Epidural when ready -Anticipate NSVD   Kerie Badger 02/04/2017, 8:46 AM

## 2017-02-05 ENCOUNTER — Encounter (HOSPITAL_COMMUNITY): Payer: Self-pay

## 2017-02-05 ENCOUNTER — Encounter (HOSPITAL_COMMUNITY)
Admission: RE | Disposition: A | Discharge status: Home / Self Care | Payer: Self-pay | Source: Ambulatory Visit | Attending: Obstetrics & Gynecology

## 2017-02-05 DIAGNOSIS — Z98891 History of uterine scar from previous surgery: Secondary | ICD-10-CM

## 2017-02-05 LAB — CBC
HEMATOCRIT: 29.8 % — AB (ref 36.0–46.0)
HEMOGLOBIN: 10.2 g/dL — AB (ref 12.0–15.0)
MCH: 32.1 pg (ref 26.0–34.0)
MCHC: 34.2 g/dL (ref 30.0–36.0)
MCV: 93.7 fL (ref 78.0–100.0)
Platelets: 320 10*3/uL (ref 150–400)
RBC: 3.18 MIL/uL — ABNORMAL LOW (ref 3.87–5.11)
RDW: 13.5 % (ref 11.5–15.5)
WBC: 20.3 10*3/uL — AB (ref 4.0–10.5)

## 2017-02-05 SURGERY — Surgical Case
Anesthesia: Epidural

## 2017-02-05 MED ORDER — ONDANSETRON HCL 4 MG/2ML IJ SOLN
INTRAMUSCULAR | Status: DC | PRN
  Administered 2017-02-05: 4 mg via INTRAVENOUS

## 2017-02-05 MED ORDER — LACTATED RINGERS IV SOLN
INTRAVENOUS | Status: DC | PRN
  Administered 2017-02-05 (×2): via INTRAVENOUS

## 2017-02-05 MED ORDER — ACETAMINOPHEN 325 MG PO TABS
650.0000 mg | ORAL_TABLET | ORAL | Status: DC | PRN
  Administered 2017-02-06 – 2017-02-07 (×2): 650 mg via ORAL
  Filled 2017-02-05 (×3): qty 2

## 2017-02-05 MED ORDER — FENTANYL CITRATE (PF) 100 MCG/2ML IJ SOLN
INTRAMUSCULAR | Status: AC
  Filled 2017-02-05: qty 2

## 2017-02-05 MED ORDER — MEPERIDINE HCL 25 MG/ML IJ SOLN
INTRAMUSCULAR | Status: AC
Start: 2017-02-05 — End: 2017-02-05
  Filled 2017-02-05: qty 1

## 2017-02-05 MED ORDER — DEXTROSE 5 % IV SOLN
3.0000 g | Freq: Once | INTRAVENOUS | Status: AC
  Administered 2017-02-05: 3 g via INTRAVENOUS
  Filled 2017-02-05: qty 3

## 2017-02-05 MED ORDER — MEPERIDINE HCL 25 MG/ML IJ SOLN
INTRAMUSCULAR | Status: AC
  Filled 2017-02-05: qty 1

## 2017-02-05 MED ORDER — PRENATAL MULTIVITAMIN CH
1.0000 | ORAL_TABLET | Freq: Every day | ORAL | Status: DC
  Administered 2017-02-05 – 2017-02-08 (×4): 1 via ORAL
  Filled 2017-02-05 (×6): qty 1

## 2017-02-05 MED ORDER — MORPHINE SULFATE (PF) 0.5 MG/ML IJ SOLN
INTRAMUSCULAR | Status: DC | PRN
  Administered 2017-02-05: 4 mg via EPIDURAL

## 2017-02-05 MED ORDER — OXYTOCIN 10 UNIT/ML IJ SOLN
INTRAMUSCULAR | Status: AC
  Filled 2017-02-05: qty 4

## 2017-02-05 MED ORDER — METOCLOPRAMIDE HCL 5 MG/ML IJ SOLN
INTRAMUSCULAR | Status: DC | PRN
  Administered 2017-02-05: 10 mg via INTRAVENOUS

## 2017-02-05 MED ORDER — SIMETHICONE 80 MG PO CHEW
80.0000 mg | CHEWABLE_TABLET | Freq: Three times a day (TID) | ORAL | Status: DC
  Administered 2017-02-05 – 2017-02-08 (×7): 80 mg via ORAL
  Filled 2017-02-05 (×12): qty 1

## 2017-02-05 MED ORDER — MEPERIDINE HCL 25 MG/ML IJ SOLN
INTRAMUSCULAR | Status: DC | PRN
  Administered 2017-02-05: 25 mg via INTRAVENOUS

## 2017-02-05 MED ORDER — ONDANSETRON HCL 4 MG/2ML IJ SOLN
INTRAMUSCULAR | Status: AC
  Filled 2017-02-05: qty 2

## 2017-02-05 MED ORDER — LIDOCAINE-EPINEPHRINE 2 %-1:100000 IJ SOLN
INTRAMUSCULAR | Status: DC | PRN
  Administered 2017-02-05 (×2): 10 mL via INTRADERMAL

## 2017-02-05 MED ORDER — PHENYLEPHRINE HCL 10 MG/ML IJ SOLN
INTRAMUSCULAR | Status: DC | PRN
  Administered 2017-02-05 (×3): 80 ug via INTRAVENOUS
  Administered 2017-02-05: 120 ug via INTRAVENOUS
  Administered 2017-02-05 (×5): 80 ug via INTRAVENOUS

## 2017-02-05 MED ORDER — SIMETHICONE 80 MG PO CHEW
80.0000 mg | CHEWABLE_TABLET | ORAL | Status: DC | PRN
Start: 2017-02-05 — End: 2017-02-08
  Filled 2017-02-05: qty 1

## 2017-02-05 MED ORDER — DIPHENHYDRAMINE HCL 25 MG PO CAPS
25.0000 mg | ORAL_CAPSULE | Freq: Four times a day (QID) | ORAL | Status: DC | PRN
  Filled 2017-02-05: qty 1

## 2017-02-05 MED ORDER — RHO D IMMUNE GLOBULIN 1500 UNIT/2ML IJ SOSY
300.0000 ug | PREFILLED_SYRINGE | Freq: Once | INTRAMUSCULAR | Status: AC
  Administered 2017-02-05: 300 ug via INTRAVENOUS
  Filled 2017-02-05: qty 2

## 2017-02-05 MED ORDER — TETANUS-DIPHTH-ACELL PERTUSSIS 5-2.5-18.5 LF-MCG/0.5 IM SUSP
0.5000 mL | Freq: Once | INTRAMUSCULAR | Status: DC
  Filled 2017-02-05: qty 0.5

## 2017-02-05 MED ORDER — OXYTOCIN 10 UNIT/ML IJ SOLN
INTRAVENOUS | Status: DC | PRN
  Administered 2017-02-05: 40 [IU] via INTRAVENOUS

## 2017-02-05 MED ORDER — SCOPOLAMINE 1 MG/3DAYS TD PT72
MEDICATED_PATCH | TRANSDERMAL | Status: AC
  Filled 2017-02-05: qty 1

## 2017-02-05 MED ORDER — OXYTOCIN 40 UNITS IN LACTATED RINGERS INFUSION - SIMPLE MED: 2.5000 [IU]/h | INTRAVENOUS | Status: AC

## 2017-02-05 MED ORDER — SODIUM CHLORIDE 0.9 % IR SOLN
Status: DC | PRN
  Administered 2017-02-05: 1

## 2017-02-05 MED ORDER — CHLOROPROCAINE HCL (PF) 3 % IJ SOLN
INTRAMUSCULAR | Status: AC
  Filled 2017-02-05: qty 20

## 2017-02-05 MED ORDER — SODIUM BICARBONATE 8.4 % IV SOLN
INTRAVENOUS | Status: DC | PRN
  Administered 2017-02-05 (×2): 5 mL via EPIDURAL

## 2017-02-05 MED ORDER — MENTHOL 3 MG MT LOZG
1.0000 | LOZENGE | OROMUCOSAL | Status: DC | PRN
  Filled 2017-02-05: qty 9

## 2017-02-05 MED ORDER — LACTATED RINGERS IV SOLN
INTRAVENOUS | Status: DC
  Administered 2017-02-05 (×2): via INTRAVENOUS

## 2017-02-05 MED ORDER — HYDROMORPHONE HCL 2 MG PO TABS
2.0000 mg | ORAL_TABLET | ORAL | Status: DC | PRN
  Administered 2017-02-06: 2 mg via ORAL
  Filled 2017-02-05: qty 1

## 2017-02-05 MED ORDER — COCONUT OIL OIL
1.0000 "application " | TOPICAL_OIL | Status: DC | PRN
  Filled 2017-02-05: qty 120

## 2017-02-05 MED ORDER — SIMETHICONE 80 MG PO CHEW
80.0000 mg | CHEWABLE_TABLET | ORAL | Status: DC
  Administered 2017-02-05 – 2017-02-07 (×3): 80 mg via ORAL
  Filled 2017-02-05 (×5): qty 1

## 2017-02-05 MED ORDER — FENTANYL CITRATE (PF) 100 MCG/2ML IJ SOLN
25.0000 ug | INTRAMUSCULAR | Status: DC | PRN
  Administered 2017-02-05 (×2): 50 ug via INTRAVENOUS

## 2017-02-05 MED ORDER — WITCH HAZEL-GLYCERIN EX PADS: 1.0000 "application " | MEDICATED_PAD | CUTANEOUS | Status: DC | PRN

## 2017-02-05 MED ORDER — MORPHINE SULFATE (PF) 0.5 MG/ML IJ SOLN
INTRAMUSCULAR | Status: AC
  Filled 2017-02-05: qty 10

## 2017-02-05 MED ORDER — DIBUCAINE 1 % RE OINT
1.0000 "application " | TOPICAL_OINTMENT | RECTAL | Status: DC | PRN
  Filled 2017-02-05: qty 28

## 2017-02-05 MED ORDER — SCOPOLAMINE 1 MG/3DAYS TD PT72
MEDICATED_PATCH | TRANSDERMAL | Status: DC | PRN
  Administered 2017-02-05: 1 via TRANSDERMAL

## 2017-02-05 MED ORDER — ZOLPIDEM TARTRATE 5 MG PO TABS: 5.0000 mg | ORAL_TABLET | Freq: Every evening | ORAL | Status: DC | PRN

## 2017-02-05 MED ORDER — CHLOROPROCAINE HCL (PF) 3 % IJ SOLN
INTRAMUSCULAR | Status: DC | PRN
  Administered 2017-02-05: 20 mL

## 2017-02-05 MED ORDER — ONDANSETRON HCL 4 MG/2ML IJ SOLN: INTRAMUSCULAR | Status: DC | PRN

## 2017-02-05 MED ORDER — SENNOSIDES-DOCUSATE SODIUM 8.6-50 MG PO TABS
2.0000 | ORAL_TABLET | ORAL | Status: DC
  Administered 2017-02-05 – 2017-02-07 (×3): 2 via ORAL
  Filled 2017-02-05 (×5): qty 2

## 2017-02-05 MED ORDER — DIPHENHYDRAMINE HCL 50 MG/ML IJ SOLN
INTRAMUSCULAR | Status: DC | PRN
  Administered 2017-02-05: 25 mg via INTRAVENOUS

## 2017-02-05 MED ORDER — PHENYLEPHRINE 40 MCG/ML (10ML) SYRINGE FOR IV PUSH (FOR BLOOD PRESSURE SUPPORT)
PREFILLED_SYRINGE | INTRAVENOUS | Status: AC
  Filled 2017-02-05: qty 20

## 2017-02-05 MED ORDER — METOCLOPRAMIDE HCL 5 MG/ML IJ SOLN
INTRAMUSCULAR | Status: AC
  Filled 2017-02-05: qty 2

## 2017-02-05 MED ORDER — IBUPROFEN 600 MG PO TABS
600.0000 mg | ORAL_TABLET | Freq: Four times a day (QID) | ORAL | Status: DC
  Administered 2017-02-05 – 2017-02-08 (×14): 600 mg via ORAL
  Filled 2017-02-05 (×13): qty 1

## 2017-02-05 SURGICAL SUPPLY — 36 items
ADH SKN CLS APL DERMABOND .7 (GAUZE/BANDAGES/DRESSINGS)
APL SKNCLS STERI-STRIP NONHPOA (GAUZE/BANDAGES/DRESSINGS) ×1
BENZOIN TINCTURE PRP APPL 2/3 (GAUZE/BANDAGES/DRESSINGS) ×2 IMPLANT
CHLORAPREP W/TINT 26ML (MISCELLANEOUS) ×2 IMPLANT
CLAMP CORD UMBIL (MISCELLANEOUS) IMPLANT
CLOSURE STERI STRIP 1/2 X4 (GAUZE/BANDAGES/DRESSINGS) ×1 IMPLANT
CLOTH BEACON ORANGE TIMEOUT ST (SAFETY) ×2 IMPLANT
DERMABOND ADVANCED (GAUZE/BANDAGES/DRESSINGS)
DERMABOND ADVANCED .7 DNX12 (GAUZE/BANDAGES/DRESSINGS) IMPLANT
DRSG OPSITE POSTOP 4X10 (GAUZE/BANDAGES/DRESSINGS) ×2 IMPLANT
ELECT REM PT RETURN 9FT ADLT (ELECTROSURGICAL) ×2
ELECTRODE REM PT RTRN 9FT ADLT (ELECTROSURGICAL) ×1 IMPLANT
EXTRACTOR VACUUM KIWI (MISCELLANEOUS) ×1 IMPLANT
GLOVE BIO SURGEON STRL SZ 6 (GLOVE) ×2 IMPLANT
GLOVE BIOGEL PI IND STRL 6 (GLOVE) ×2 IMPLANT
GLOVE BIOGEL PI IND STRL 7.0 (GLOVE) ×1 IMPLANT
GLOVE BIOGEL PI INDICATOR 6 (GLOVE) ×2
GLOVE BIOGEL PI INDICATOR 7.0 (GLOVE) ×1
GOWN STRL REUS W/TWL LRG LVL3 (GOWN DISPOSABLE) ×4 IMPLANT
KIT ABG SYR 3ML LUER SLIP (SYRINGE) ×2 IMPLANT
NDL HYPO 25X5/8 SAFETYGLIDE (NEEDLE) ×1 IMPLANT
NEEDLE HYPO 25X5/8 SAFETYGLIDE (NEEDLE) ×2 IMPLANT
NS IRRIG 1000ML POUR BTL (IV SOLUTION) ×2 IMPLANT
PACK C SECTION WH (CUSTOM PROCEDURE TRAY) ×2 IMPLANT
PAD OB MATERNITY 4.3X12.25 (PERSONAL CARE ITEMS) ×2 IMPLANT
PENCIL SMOKE EVAC W/HOLSTER (ELECTROSURGICAL) ×2 IMPLANT
STRIP CLOSURE SKIN 1/2X4 (GAUZE/BANDAGES/DRESSINGS) IMPLANT
SUT CHROMIC 0 CTX 36 (SUTURE) ×6 IMPLANT
SUT MON AB 2-0 CT1 27 (SUTURE) ×2 IMPLANT
SUT PDS AB 0 CT1 27 (SUTURE) IMPLANT
SUT PLAIN 0 NONE (SUTURE) IMPLANT
SUT VIC AB 0 CT1 36 (SUTURE) ×1 IMPLANT
SUT VIC AB 4-0 KS 27 (SUTURE) ×1 IMPLANT
TOWEL OR 17X24 6PK STRL BLUE (TOWEL DISPOSABLE) ×2 IMPLANT
TRAY FOLEY BAG SILVER LF 14FR (SET/KITS/TRAYS/PACK) IMPLANT
YANKAUER SUCT BULB TIP NO VENT (SUCTIONS) ×1 IMPLANT

## 2017-02-05 NOTE — Progress Notes (Signed)
The patient has been pushing effectively x 2 hours.  Variable decelerations noted with each contraction.  +Scalp stim and improvement in variables with repositioning but unable to completely resolved.  OP presentation noted and no descent from +1 station.  I recommend primary cesarean section secondary to arrest of descent.  The patient is counseled re: risk of bleeding, infection, scarring, and damage to surrounding structures.  All questions were answered and the patient will proceed.  Mitchel HonourMegan Markesha Hannig, DO

## 2017-02-05 NOTE — Progress Notes (Signed)
Attempted orthostatic vitals, patient able to sit on the side of the bed and stand at bedside with assistance. Feels too weak in legs to walk, states they have a heavy feeling and may fall. Patient assisted back to bed and will re-evaluate and attempt for orthostatic vitals in approximately one hour.  BP ranging from low 100's (systolic) over 40's and 50's (diastolic). No significant dizziness noted. Will continue to monitor. Newman PiesWiggins, Dewight Catino T, RN

## 2017-02-05 NOTE — Transfer of Care (Signed)
Immediate Anesthesia Transfer of Care Note  Patient: Cynthia English  Procedure(s) Performed: CESAREAN SECTION (N/A )  Patient Location: PACU  Anesthesia Type:Epidural  Level of Consciousness: awake, alert  and oriented  Airway & Oxygen Therapy: Patient Spontanous Breathing  Post-op Assessment: Report given to RN and Post -op Vital signs reviewed and stable  Post vital signs: Reviewed and stable  Last Vitals:  Vitals:   02/04/17 2331 02/05/17 0100  BP: 134/70 (!) 145/54  Pulse: 85 85  Resp: 18   Temp:  36.9 C  SpO2:      Last Pain:  Vitals:   02/05/17 0100  TempSrc: Oral  PainSc:          Complications: No apparent anesthesia complications

## 2017-02-05 NOTE — Anesthesia Postprocedure Evaluation (Signed)
Anesthesia Post Note  Patient: Cynthia English  Procedure(s) Performed: CESAREAN SECTION (N/A )     Anesthesia Post Evaluation  Last Vitals:  Vitals:   02/05/17 0415 02/05/17 0430  BP: 123/64 109/65  Pulse: (!) 102 94  Resp: (!) 21 13  Temp:  36.9 C  SpO2: 98% 98%    Last Pain:  Vitals:   02/05/17 0430  TempSrc: Oral  PainSc: 3    Pain Goal:                 Jiles GarterJACKSON,Giavana Rooke EDWARD

## 2017-02-05 NOTE — Progress Notes (Addendum)
   02/05/17 1525  Vital Signs  BP (!) 96/52  BP Location Left Arm  Patient Position (if appropriate) Sitting (on side of bed)  BP Method Automatic  Pulse Rate 67  Pulse Rate Source Dinamap  Complaints & Interventions  Complains of Other (Comment) (dizziness, lightheaded)  Interventions Other (comment) (back to bed)  Orthostatic Vitals and ambulation to bathroom attempted again. Patient complaint of increased dizziness and being lightheaded sitting on side of the bed. Unable to stand. BP slightly decreased.  UO (foley) decreased.  Pitocin stopped and LR started at 125 mL/hr.     Encouraged patient to continue to push fluids, has had breakfast and lunch. Will continue to monitor.

## 2017-02-05 NOTE — Op Note (Signed)
Cynthia MackintoshErica N English PROCEDURE DATE: 02/05/2017  PREOPERATIVE DIAGNOSIS: Intrauterine pregnancy at  4318w2d weeks gestation, arrest of descent  POSTOPERATIVE DIAGNOSIS: The same, deep transverse arrest  PROCEDURE:  Primary Low Transverse Cesarean Section  SURGEON:  Dr. Mitchel HonourMegan Aniella Wandrey  INDICATIONS: Cynthia Mackintoshrica N English is a 32 y.o. G2P0010 at 1618w2d scheduled for cesarean section secondary to arrest of descent.  The risks of cesarean section discussed with the patient included but were not limited to: bleeding which may require transfusion or reoperation; infection which may require antibiotics; injury to bowel, bladder, ureters or other surrounding organs; injury to the fetus; need for additional procedures including hysterectomy in the event of a life-threatening hemorrhage; placental abnormalities wth subsequent pregnancies, incisional problems, thromboembolic phenomenon and other postoperative/anesthesia complications. The patient concurred with the proposed plan, giving informed written consent for the procedure.    FINDINGS:  Viable female infant in cephalic (transverse) presentation, APGARs 9,9: weight pending  Meconium stained amniotic fluid.  Intact placenta, three vessel cord.  Grossly normal uterus, ovaries and fallopian tubes. .   ANESTHESIA:    Epidural ESTIMATED BLOOD LOSS: 1877 mL ml SPECIMENS: Placenta sent to L&D COMPLICATIONS: Hemorrhage related to tissue trauma (left hysterotomy extension)  PROCEDURE IN DETAIL:  The patient received intravenous antibiotics and had sequential compression devices applied to her lower extremities while in the preoperative area.  She was then taken to the operating room where epidural anesthesia was dosed up to surgical level and was found to be adequate. She was then placed in a dorsal supine position with a leftward tilt, and prepped and draped in a sterile manner.  A foley catheter was placed into her bladder and attached to constant gravity.  After an adequate  timeout was performed, a Pfannenstiel skin incision was made with scalpel and carried through to the underlying layer of fascia. The fascia was incised in the midline and this incision was extended bilaterally using the Mayo scissors. Kocher clamps were applied to the superior aspect of the fascial incision and the underlying rectus muscles were dissected off bluntly. A similar process was carried out on the inferior aspect of the facial incision. The rectus muscles were separated in the midline bluntly and the peritoneum was entered bluntly.  Bladder flap was created sharply and developed bluntly. Bladder blade was placed.  A transverse hysterotomy was made with a scalpel and extended bilaterally bluntly. The bladder blade was then removed. The infant was successfully delivered using a single Kiwi vacuum pull, and cord was clamped and cut and infant was handed over to awaiting neonatology team. Uterine massage was then administered and the placenta delivered intact with three-vessel cord. The uterus was cleared of clot and debris.  The hysterotomy was closed with 0 chromic to include left sided extensionl.  A second imbricating suture of 0-chromic was used to reinforce the incision and aid in hemostasis.  The peritoneum and rectus muscles were noted to be hemostatic and were reapproximated using 3-0 monocryl in a running fashion.  The fascia was closed with 0-Vicryl in a running fashion with good restoration of anatomy.  The subcutaneus tissue was copiously irrigated.  The skin was closed with 4-0 vicryl in a subcuticular fashion.  Pt tolerated the procedure will.  All counts were correct x2.  Pt went to the recovery room in stable condition.

## 2017-02-05 NOTE — Anesthesia Postprocedure Evaluation (Signed)
Anesthesia Post Note  Patient: Cynthia English  Procedure(s) Performed: CESAREAN SECTION (N/A )     Patient location during evaluation: Mother Baby Anesthesia Type: Epidural Level of consciousness: awake, awake and alert, oriented and patient cooperative Pain management: pain level controlled Vital Signs Assessment: post-procedure vital signs reviewed and stable Respiratory status: spontaneous breathing, nonlabored ventilation and respiratory function stable Cardiovascular status: stable Postop Assessment: no headache, no backache, patient able to bend at knees and no apparent nausea or vomiting Anesthetic complications: no    Last Vitals:  Vitals:   02/05/17 0645 02/05/17 0737  BP: (!) 103/57 115/60  Pulse: 74 72  Resp: 18 18  Temp: 36.5 C 37.6 C  SpO2: 97% 96%    Last Pain:  Vitals:   02/05/17 0737  TempSrc: Oral  PainSc: 0-No pain   Pain Goal:                 [name] L

## 2017-02-05 NOTE — Anesthesia Postprocedure Evaluation (Signed)
Anesthesia Post Note  Patient: Cynthia English  Procedure(s) Performed: CESAREAN SECTION (N/A )     Patient location during evaluation: PACU Anesthesia Type: Epidural Level of consciousness: awake Pain management: satisfactory to patient Vital Signs Assessment: post-procedure vital signs reviewed and stable Respiratory status: spontaneous breathing Cardiovascular status: blood pressure returned to baseline Postop Assessment: no headache and spinal receding Anesthetic complications: no    Last Vitals:  Vitals:   02/05/17 0415 02/05/17 0430  BP: 123/64 109/65  Pulse: (!) 102 94  Resp: (!) 21 13  Temp:  36.9 C  SpO2: 98% 98%    Last Pain:  Vitals:   02/05/17 0430  TempSrc: Oral  PainSc: 3    Pain Goal:                 Jiles GarterJACKSON,Cheryl Chay EDWARD

## 2017-02-05 NOTE — Progress Notes (Signed)
BP measurements improved. Ambulation to bathroom tolerated well without complication. Newman PiesWiggins, Lenis Nettleton T, RN

## 2017-02-05 NOTE — Progress Notes (Signed)
Patient without complaints.  BP 115/60 (BP Location: Left Arm)   Pulse 72   Temp 99.7 F (37.6 C) (Oral)   Resp 18   Ht 5\' 7"  (1.702 m)   Wt 121.5 kg (267 lb 12.8 oz)   SpO2 96%   Breastfeeding? Unknown   BMI 41.94 kg/m  Results for orders placed or performed during the hospital encounter of 02/04/17 (from the past 24 hour(s))  CBC     Status: Abnormal   Collection Time: 02/05/17  8:05 AM  Result Value Ref Range   WBC 20.3 (H) 4.0 - 10.5 K/uL   RBC 3.18 (L) 3.87 - 5.11 MIL/uL   Hemoglobin 10.2 (L) 12.0 - 15.0 g/dL   HCT 82.929.8 (L) 56.236.0 - 13.046.0 %   MCV 93.7 78.0 - 100.0 fL   MCH 32.1 26.0 - 34.0 pg   MCHC 34.2 30.0 - 36.0 g/dL   RDW 86.513.5 78.411.5 - 69.615.5 %   Platelets 320 150 - 400 K/uL   Abdomen is soft and non tender Incision is dry Normal lochia  POD # 0  Doing well Routine care Desires circ

## 2017-02-05 NOTE — Lactation Note (Signed)
This note was copied from a baby's chart. Lactation Consultation Note  Patient Name: Cynthia English Reason for consult: Initial assessment   P1, Baby 9 hours old.  IVF. Reviewed hand expression with drops expressed. Unwrapped baby for feeding and latched in football hold bilaterally. Sucks and swallows observed. Reviewed basics. Mom encouraged to feed baby 8-12 times/24 hours and with feeding cues.  Mom made aware of O/P services, breastfeeding support groups, community resources, and our phone # for post-discharge questions.      Maternal Data Has patient been taught Hand Expression?: Yes Does the patient have breastfeeding experience prior to this delivery?: No  Feeding Feeding Type: Breast Fed Length of feed: 10 min  LATCH Score Latch: Grasps breast easily, tongue down, lips flanged, rhythmical sucking.  Audible Swallowing: A few with stimulation  Type of Nipple: Everted at rest and after stimulation  Comfort (Breast/Nipple): Soft / non-tender  Hold (Positioning): Assistance needed to correctly position infant at breast and maintain latch.  LATCH Score: 8  Interventions Interventions: Breast feeding basics reviewed;Assisted with latch;Skin to skin;Breast massage;Pre-pump if needed;Hand express;Breast compression;Support pillows;DEBP  Lactation Tools Discussed/Used     Consult Status Consult Status: Follow-up Date: 02/06/17 Follow-up type: In-patient    Dahlia ByesBerkelhammer, Ruth [facility] CenterBoschen English, 12:13 PM

## 2017-02-05 NOTE — Progress Notes (Signed)
   02/05/17 2317  Vital Signs  Patient Position (if appropriate) Orthostatic Vitals  Temp 98.2 F (36.8 C)  Temp Source Oral  Orthostatic Lying   BP- Lying 94/44  Pulse- Lying 68  Orthostatic Sitting  BP- Sitting 96/48  Pulse- Sitting 78  Orthostatic Standing at 0 minutes  BP- Standing at 0 minutes 105/51  Pulse- Standing at 0 minutes 83  Did an orthostatic before getting pt up to the bathroom. Pt walked to the bath room fine, without dizziness, headaches or blurred vision. Empty foley with 375ml out. Will take out foley at last 4hrs check at 0200 if no complications arises.

## 2017-02-05 NOTE — Addendum Note (Signed)
Addendum  created 02/05/17 0834 by Yolonda Kidaarver, Sami Roes L, CRNA   Sign clinical note

## 2017-02-06 LAB — CBC
HCT: 22.8 % — ABNORMAL LOW (ref 36.0–46.0)
Hemoglobin: 7.6 g/dL — ABNORMAL LOW (ref 12.0–15.0)
MCH: 31.7 pg (ref 26.0–34.0)
MCHC: 33.3 g/dL (ref 30.0–36.0)
MCV: 95 fL (ref 78.0–100.0)
PLATELETS: 260 10*3/uL (ref 150–400)
RBC: 2.4 MIL/uL — ABNORMAL LOW (ref 3.87–5.11)
RDW: 13.8 % (ref 11.5–15.5)
WBC: 14.7 10*3/uL — ABNORMAL HIGH (ref 4.0–10.5)

## 2017-02-06 LAB — RH IG WORKUP (INCLUDES ABO/RH)
ABO/RH(D): O NEG
FETAL SCREEN: NEGATIVE
Gestational Age(Wks): 40.2
Unit division: 0

## 2017-02-06 MED ORDER — OXYCODONE HCL 5 MG PO TABS
5.0000 mg | ORAL_TABLET | ORAL | Status: DC | PRN
  Administered 2017-02-06 – 2017-02-07 (×4): 5 mg via ORAL
  Filled 2017-02-06 (×4): qty 1

## 2017-02-06 MED ORDER — SODIUM CHLORIDE 0.9 % IV SOLN
510.0000 mg | Freq: Once | INTRAVENOUS | Status: AC
  Administered 2017-02-06: 510 mg via INTRAVENOUS
  Filled 2017-02-06: qty 17

## 2017-02-06 NOTE — Progress Notes (Signed)
Patient doing well. Felt a little weak with activity   BP (!) 88/32 (BP Location: Right Arm)   Pulse 79   Temp 98.1 F (36.7 C) (Oral)   Resp 18   Ht 5\' 7"  (1.702 m)   Wt 121.5 kg (267 lb 12.8 oz)   SpO2 96%   Breastfeeding? Unknown   BMI 41.94 kg/m  Abdomen is soft and non tender Bandage clean and dry  Results for orders placed or performed during the hospital encounter of 02/04/17 (from the past 24 hour(s))  CBC     Status: Abnormal   Collection Time: 02/05/17  8:05 AM  Result Value Ref Range   WBC 20.3 (H) 4.0 - 10.5 K/uL   RBC 3.18 (L) 3.87 - 5.11 MIL/uL   Hemoglobin 10.2 (L) 12.0 - 15.0 g/dL   HCT 53.629.8 (L) 64.436.0 - 03.446.0 %   MCV 93.7 78.0 - 100.0 fL   MCH 32.1 26.0 - 34.0 pg   MCHC 34.2 30.0 - 36.0 g/dL   RDW 74.213.5 59.511.5 - 63.815.5 %   Platelets 320 150 - 400 K/uL  Rh IG workup (includes ABO/Rh)     Status: None (Preliminary result)   Collection Time: 02/05/17  8:05 AM  Result Value Ref Range   Gestational Age(Wks) 40.2    ABO/RH(D) O NEG    Fetal Screen NEG    Unit Number [id number]    Blood Component Type RHIG    Unit division 00    Status of Unit ISSUED    Transfusion Status OK TO TRANSFUSE   CBC     Status: Abnormal   Collection Time: 02/06/17  5:53 AM  Result Value Ref Range   WBC 14.7 (H) 4.0 - 10.5 K/uL   RBC 2.40 (L) 3.87 - 5.11 MIL/uL   Hemoglobin 7.6 (L) 12.0 - 15.0 g/dL   HCT 16.622.8 (L) 06.336.0 - 01.646.0 %   MCV 95.0 78.0 - 100.0 fL   MCH 31.7 26.0 - 34.0 pg   MCHC 33.3 30.0 - 36.0 g/dL   RDW 01.013.8 93.211.5 - 35.515.5 %   Platelets 260 150 - 400 K/uL   POD # 1  Anemia Doing ok - will give feraheme infusion today circ today  Remove foley

## 2017-02-06 NOTE — Discharge Instructions (Signed)
Iron-Rich Diet Iron is a mineral that helps your body to produce hemoglobin. Hemoglobin is a protein in your red blood cells that carries oxygen to your body's tissues. Eating too little iron may cause you to feel weak and tired, and it can increase your risk for infection. Eating enough iron is necessary for your body's metabolism, muscle function, and nervous system. Iron is naturally found in many foods. It can also be added to foods or fortified in foods. There are two types of dietary iron:  Heme iron. Heme iron is absorbed by the body more easily than nonheme iron. Heme iron is found in meat, poultry, and fish.  Nonheme iron. Nonheme iron is found in dietary supplements, iron-fortified grains, beans, and vegetables.  You may need to follow an iron-rich diet if:  You have been diagnosed with iron deficiency or iron-deficiency anemia.  You have a condition that prevents you from absorbing dietary iron, such as: ? Infection in your intestines. ? Celiac disease. This involves long-lasting (chronic) inflammation of your intestines.  You do not eat enough iron.  You eat a diet that is high in foods that impair iron absorption.  You have lost a lot of blood.  You have heavy bleeding during your menstrual cycle.  You are pregnant.  What is my plan? Your health care provider may help you to determine how much iron you need per day based on your condition. Generally, when a person consumes sufficient amounts of iron in the diet, the following iron needs are met:  Men. ? 66-61 years old: 11 mg per day. ? 16-18 years old: 8 mg per day.  Women. ? 50-5 years old: 15 mg per day. ? 47-2 years old: 18 mg per day. ? Over 43 years old: 8 mg per day. ? Pregnant women: 27 mg per day. ? Breastfeeding women: 9 mg per day.  What do I need to know about an iron-rich diet?  Eat fresh fruits and vegetables that are high in vitamin C along with foods that are high in iron. This will help  increase the amount of iron that your body absorbs from food, especially with foods containing nonheme iron. Foods that are high in vitamin C include oranges, peppers, tomatoes, and mango.  Take iron supplements only as directed by your health care provider. Overdose of iron can be life-threatening. If you were prescribed iron supplements, take them with orange juice or a vitamin C supplement.  Cook foods in pots and pans that are made from iron.  Eat nonheme iron-containing foods alongside foods that are high in heme iron. This helps to improve your iron absorption.  Certain foods and drinks contain compounds that impair iron absorption. Avoid eating these foods in the same meal as iron-rich foods or with iron supplements. These include: ? Coffee, black tea, and red wine. ? Milk, dairy products, and foods that are high in calcium. ? Beans, soybeans, and peas. ? Whole grains.  When eating foods that contain both nonheme iron and compounds that impair iron absorption, follow these tips to absorb iron better. ? Soak beans overnight before cooking. ? Soak whole grains overnight and drain them before using. ? Ferment flours before baking, such as using yeast in bread dough. What foods can I eat? Grains Iron-fortified breakfast cereal. Iron-fortified whole-wheat bread. Enriched rice. Sprouted grains. Vegetables Spinach. Potatoes with skin. Green peas. Broccoli. Red and green bell peppers. Fermented vegetables. Fruits Prunes. Raisins. Oranges. Strawberries. Mango. Grapefruit. Meats and Other Protein Sources  Beef liver. Oysters. Beef. Shrimp. Turkey. Chicken. Tuna. Sardines. Chickpeas. Nuts. Tofu. °Beverages °Tomato juice. Fresh orange juice. Prune juice. Hibiscus tea. Fortified instant breakfast shakes. °Condiments °Tahini. Fermented soy sauce. °Sweets and Desserts °Black-strap molasses. °Other °Wheat germ. °The items listed above may not be a complete list of recommended foods or beverages.  Contact your dietitian for more options. °What foods are not recommended? °Grains °Whole grains. Bran cereal. Bran flour. Oats. °Vegetables °Artichokes. Brussels sprouts. Kale. °Fruits °Blueberries. Raspberries. Strawberries. Figs. °Meats and Other Protein Sources °Soybeans. Products made from soy protein. °Dairy °Milk. Cream. Cheese. Yogurt. Cottage cheese. °Beverages °Coffee. Black tea. Red wine. °Sweets and Desserts °Cocoa. Chocolate. Ice cream. °Other °Basil. Oregano. Parsley. °The items listed above may not be a complete list of foods and beverages to avoid. Contact your dietitian for more information. °This information is not intended to replace advice given to you by your health care provider. Make sure you discuss any questions you have with your health care provider. °Document Released: 08/26/2004 Document Revised: 08/02/2015 Document Reviewed: 08/09/2013 °Elsevier Interactive Patient Education © 2018 [employer] Inc. ° °

## 2017-02-06 NOTE — Progress Notes (Signed)
Dr. Vincente PoliGrewal called and is informed of pt low blood pressure trend, though pt is asymptomatic. MD wants to keep LR running and foley in until she round in morning.

## 2017-02-06 NOTE — Progress Notes (Signed)
Pt c/o pain at incisional site and states the Prn Dilaudid and tylenol isn't helping. Noted Patient to have an allergy to Percocet (which causes itching), but pt states "I can take plan Oxycodone with no problem because that's what they give me for my kidney stones". Notified MD on call. New orders received. Will continue to monitor.

## 2017-02-06 NOTE — Lactation Note (Signed)
This note was copied from a baby's chart. Lactation Consultation Note  Patient Name: Cynthia English: 02/06/2017 Reason for consult: Follow-up assessment;Nipple pain/trauma;1st time breastfeeding;Hyperbilirubinemia;Term   Follow up with mom of 37 hour old infant at mom and RN request due to sore nipples. Mom's nipples are sore and noted to be bruised to tip of nipple and areola. Mom reports infant has been cluster feeding this afternoon, parents started formula today as they were concerned infant was note getting enough. Infant has been started on phototherapy today. Infant has bruising to face and scalp.   Infant was awake and crying, infant recently received 12 cc formula via curved tip syringe. Infant latched easily in the cross cradle hold to the right breast.Mom had difficulty keeping infant latched. Repositioned to the football hold and relatched, infant latched better. Reviewed the importance of deep latch throughout feeding. Assisted with pillow and head support. Infant was sleepy at the breast despite infant stimulation. Infant was removed from breast and dad was holding him. Nipple was round when infant came off. Infant asleep post BF. Showed dad how to assist mom with latching infant with the Tea Cup hold, awakening techniques and breast compression/massage with feeding.   Reviewed importance of allowing infant to go to breast prior to offering supplement as able. Reviewed importance of pumping to stimulate milk production after BF.   Mom wanted to try a NS, LC sees no indication for using a NS at this time. Enc mom to apply EBM to nipples post BF/pumping and then apply Comfort gels. Comfort gels given with instructions for use and cleaning.   DEBP set up with instructions for use on Initiate setting, assembling, disassembling, and cleaning of pump parts.  Plan written on the board for mom to follow as she can   Enc mom to BF with each feeding for as long as infant wants.   Follow with supplementation of EBM or formula as needed Pump for 15 minutes with DEBP Hand express on each breast Call for assistance as needed.   Parents report al questions have been answered at this time.    Maternal Data Has patient been taught Hand Expression?: Yes Does the patient have breastfeeding experience prior to this delivery?: No  Feeding Feeding Type: Breast Fed Length of feed: 15 min  LATCH Score Latch: Repeated attempts needed to sustain latch, nipple held in mouth throughout feeding, stimulation needed to elicit sucking reflex.  Audible Swallowing: A few with stimulation  Type of Nipple: Everted at rest and after stimulation  Comfort (Breast/Nipple): Filling, red/small blisters or bruises, mild/mod discomfort  Hold (Positioning): Assistance needed to correctly position infant at breast and maintain latch.  LATCH Score: 6  Interventions Interventions: Breast feeding basics reviewed;Support pillows;Assisted with latch;Position options;Skin to skin;Expressed milk;Breast massage;Breast compression;Comfort gels  Lactation Tools Discussed/Used Tools: Pump;Comfort gels Breast pump type: Double-Electric Breast Pump WIC Program: No Pump Review: Setup, frequency, and cleaning;Milk Storage Initiated by:: Cynthia StainSharon Blakelyn Dinges, RN, IBCLC English initiated:: 02/06/17   Consult Status Consult Status: Follow-up English: 02/07/17 Follow-up type: In-patient    Silas FloodSharon S Myiah Petkus 02/06/2017, 4:51 PM

## 2017-02-07 NOTE — Progress Notes (Signed)
Patient doing well. Feeling stronger. Received feraheme yesterday.  BP (!) 97/59   Pulse 78   Temp 98.3 F (36.8 C) (Oral)   Resp 20   Ht 5\' 7"  (1.702 m)   Wt 121.5 kg (267 lb 12.8 oz)   SpO2 96%   Breastfeeding? Unknown   BMI 41.94 kg/m  No results found for this or any previous visit (from the past 24 hour(s)). Abdomen is soft and non tender Bandage clean and dry  POD # 2 Doing well Routine care Discharge home tomorrow

## 2017-02-08 ENCOUNTER — Inpatient Hospital Stay (HOSPITAL_COMMUNITY): Payer: BLUE CROSS/BLUE SHIELD

## 2017-02-08 MED ORDER — IBUPROFEN 600 MG PO TABS: 600.0000 mg | ORAL_TABLET | Freq: Four times a day (QID) | ORAL | 0 refills | Status: DC

## 2017-02-08 MED ORDER — PRENATAL MULTIVITAMIN CH: 1.0000 | ORAL_TABLET | Freq: Every day | ORAL | 6 refills | Status: DC

## 2017-02-08 MED ORDER — OXYCODONE-ACETAMINOPHEN 5-325 MG PO TABS
1.0000 | ORAL_TABLET | ORAL | Status: DC | PRN
  Administered 2017-02-08 (×2): 1 via ORAL
  Filled 2017-02-08 (×2): qty 1

## 2017-02-08 MED ORDER — HYDROMORPHONE HCL 2 MG PO TABS: 2.0000 mg | ORAL_TABLET | ORAL | 0 refills | Status: DC | PRN

## 2017-02-08 NOTE — Lactation Note (Signed)
This note was copied from a baby's chart. Lactation Consultation Note  Patient Name: Boy Scharlene Glossrica Caradonna ZOXWR'UToday's Date: 02/08/2017 Reason for consult: Follow-up assessment;Other (Comment);Infant weight loss(s/p Double Photo, per mom not using NS , last to the breast around 0600 am , 7% weight loss, pumping )  Per mom has been finger feeding the baby EBM today and not using the NS to latch.  Has been pumping regularly with increased volumes and milk in. Mom not engorged. Sore nipple  And engorgement prevention and tx reviewed. Per mom will have a DEBP at home.   2nd LC visit for this baby, dad came out and requested re- visit. As LC entered the room baby latched  With depth with the help from the RN.  LC showed mom how to do the breast compressions and the heat of the breast decreased and the baby  Picked up with swallows and fed for 15 mins, when baby released the nipple was well rounded and he  Fell asleep.  LC instructed mom on the use shells between feeding to elongate the nipple and areola complex for  A deeper latch.  LC reassured mom and dad that the more practice the baby latches at the breast , latching will get easier.    Maternal Data Has patient been taught Hand Expression?: (per mom feels comfortable with technique )  Feeding    LATCH Score                   Interventions Interventions: Breast feeding basics reviewed  Lactation Tools Discussed/Used Pump Review: Milk Storage(LC reviewed )   Consult Status Consult Status: Follow-up Date: (LC offered mom and LC O/P appt , and mom recptove and aware she will be called ) Follow-up type: Out-patient(at [facility] for [facility] O/P appt. )    Matilde SprangMargaret Ann Laurencia Roma 02/08/2017, 3:40 PM

## 2017-02-08 NOTE — Discharge Summary (Signed)
Obstetric Discharge Summary Reason for Admission: induction of labor Prenatal Procedures: ultrasound Intrapartum Procedures: cesarean: low cervical, transverse Postpartum Procedures: none Complications-Operative and Postpartum: none Hemoglobin  Date Value Ref Range Status  02/06/2017 7.6 (L) 12.0 - 15.0 g/dL Final    Comment:    DELTA CHECK NOTED REPEATED TO VERIFY    HCT  Date Value Ref Range Status  02/06/2017 22.8 (L) 36.0 - 46.0 % Final    Physical Exam:  General: alert and cooperative Lochia: appropriate Uterine Fundus: firm Incision: healing well, no significant drainage DVT Evaluation: No evidence of DVT seen on physical exam.  Discharge Diagnoses: Term Pregnancy-delivered  Discharge Information: Date: 02/08/2017 Activity: pelvic rest Diet: routine Medications: PNV, Ibuprofen and dilaudid Condition: stable Instructions: refer to practice specific booklet Discharge to: home Follow-up Information    [place], [facility] Schedule an appointment as soon as possible for a visit in 4 day(s).   Contact information: [address] [place] [address] [phone]           Newborn Data: Live born female  Birth Weight: 8 lb 8 oz (3855 g) APGAR: 9, 9  Newborn Delivery   Birth date/time:  02/05/2017 02:29:00 Delivery type:  C-Section, Vacuum Assisted C-section categorization:  Primary     Home with mother.  Cynthia English 02/08/2017, 8:49 AM

## 2017-03-15 DIAGNOSIS — Z1389 Encounter for screening for other disorder: Secondary | ICD-10-CM | POA: Diagnosis not present

## 2017-03-15 DIAGNOSIS — D509 Iron deficiency anemia, unspecified: Secondary | ICD-10-CM | POA: Diagnosis not present

## 2017-04-19 ENCOUNTER — Other Ambulatory Visit: Payer: Self-pay | Admitting: Internal Medicine

## 2017-04-19 DIAGNOSIS — R1084 Generalized abdominal pain: Secondary | ICD-10-CM

## 2017-04-19 DIAGNOSIS — R112 Nausea with vomiting, unspecified: Secondary | ICD-10-CM | POA: Diagnosis not present

## 2017-04-20 ENCOUNTER — Ambulatory Visit
Admission: RE | Admit: 2017-04-20 | Discharge: 2017-04-20 | Disposition: A | Discharge status: Home / Self Care | Payer: BLUE CROSS/BLUE SHIELD | Source: Ambulatory Visit | Attending: Internal Medicine | Admitting: Internal Medicine

## 2017-04-20 DIAGNOSIS — R1084 Generalized abdominal pain: Secondary | ICD-10-CM

## 2017-04-20 DIAGNOSIS — R1010 Upper abdominal pain, unspecified: Secondary | ICD-10-CM | POA: Diagnosis not present

## 2017-04-23 ENCOUNTER — Other Ambulatory Visit (HOSPITAL_COMMUNITY): Payer: Self-pay | Admitting: Internal Medicine

## 2017-04-23 DIAGNOSIS — K8020 Calculus of gallbladder without cholecystitis without obstruction: Secondary | ICD-10-CM

## 2017-04-23 DIAGNOSIS — R110 Nausea: Secondary | ICD-10-CM | POA: Diagnosis not present

## 2017-04-25 ENCOUNTER — Other Ambulatory Visit: Payer: Self-pay

## 2017-04-25 ENCOUNTER — Emergency Department (HOSPITAL_COMMUNITY): Payer: BLUE CROSS/BLUE SHIELD

## 2017-04-25 ENCOUNTER — Encounter (HOSPITAL_COMMUNITY): Payer: Self-pay | Admitting: *Deleted

## 2017-04-25 ENCOUNTER — Emergency Department (HOSPITAL_COMMUNITY)
Admission: EM | Admit: 2017-04-25 | Discharge: 2017-04-25 | Disposition: A | Discharge status: Home / Self Care | Payer: BLUE CROSS/BLUE SHIELD | Attending: Emergency Medicine | Admitting: Emergency Medicine

## 2017-04-25 DIAGNOSIS — Z79899 Other long term (current) drug therapy: Secondary | ICD-10-CM | POA: Insufficient documentation

## 2017-04-25 DIAGNOSIS — R1013 Epigastric pain: Secondary | ICD-10-CM | POA: Insufficient documentation

## 2017-04-25 DIAGNOSIS — R112 Nausea with vomiting, unspecified: Secondary | ICD-10-CM | POA: Insufficient documentation

## 2017-04-25 DIAGNOSIS — R1011 Right upper quadrant pain: Secondary | ICD-10-CM | POA: Insufficient documentation

## 2017-04-25 DIAGNOSIS — K8020 Calculus of gallbladder without cholecystitis without obstruction: Secondary | ICD-10-CM | POA: Diagnosis not present

## 2017-04-25 LAB — COMPREHENSIVE METABOLIC PANEL
ALT: 71 U/L — AB (ref 14–54)
AST: 54 U/L — AB (ref 15–41)
Albumin: 4 g/dL (ref 3.5–5.0)
Alkaline Phosphatase: 144 U/L — ABNORMAL HIGH (ref 38–126)
Anion gap: 12 (ref 5–15)
BILIRUBIN TOTAL: 1.2 mg/dL (ref 0.3–1.2)
BUN: 14 mg/dL (ref 6–20)
CALCIUM: 9.8 mg/dL (ref 8.9–10.3)
CO2: 21 mmol/L — ABNORMAL LOW (ref 22–32)
CREATININE: 0.98 mg/dL (ref 0.44–1.00)
Chloride: 106 mmol/L (ref 101–111)
GFR calc Af Amer: >60 mL/min (ref >60)
Glucose, Bld: 101 mg/dL — ABNORMAL HIGH (ref 65–99)
Potassium: 3.8 mmol/L (ref 3.5–5.1)
Sodium: 139 mmol/L (ref 135–145)
TOTAL PROTEIN: 7.6 g/dL (ref 6.5–8.1)

## 2017-04-25 LAB — URINALYSIS, ROUTINE W REFLEX MICROSCOPIC
BILIRUBIN URINE: NEGATIVE
Bacteria, UA: NONE SEEN
Glucose, UA: NEGATIVE mg/dL
HGB URINE DIPSTICK: NEGATIVE
Ketones, ur: NEGATIVE mg/dL
NITRITE: NEGATIVE
PH: 7 (ref 5.0–8.0)
Protein, ur: NEGATIVE mg/dL
SPECIFIC GRAVITY, URINE: 1.023 (ref 1.005–1.030)

## 2017-04-25 LAB — CBC
HCT: 42.5 % (ref 36.0–46.0)
Hemoglobin: 14.2 g/dL (ref 12.0–15.0)
MCH: 30.8 pg (ref 26.0–34.0)
MCHC: 33.4 g/dL (ref 30.0–36.0)
MCV: 92.2 fL (ref 78.0–100.0)
PLATELETS: 422 10*3/uL — AB (ref 150–400)
RBC: 4.61 MIL/uL (ref 3.87–5.11)
RDW: 12.7 % (ref 11.5–15.5)
WBC: 7.5 10*3/uL (ref 4.0–10.5)

## 2017-04-25 LAB — LIPASE, BLOOD: Lipase: 46 U/L (ref 11–51)

## 2017-04-25 LAB — I-STAT BETA HCG BLOOD, ED (MC, WL, AP ONLY)

## 2017-04-25 MED ORDER — GI COCKTAIL ~~LOC~~
30.0000 mL | Freq: Once | ORAL | Status: AC
  Administered 2017-04-25: 30 mL via ORAL
  Filled 2017-04-25: qty 30

## 2017-04-25 MED ORDER — SODIUM CHLORIDE 0.9 % IV BOLUS
1000.0000 mL | Freq: Once | INTRAVENOUS | Status: AC
  Administered 2017-04-25: 1000 mL via INTRAVENOUS

## 2017-04-25 MED ORDER — FAMOTIDINE IN NACL 20-0.9 MG/50ML-% IV SOLN
20.0000 mg | Freq: Once | INTRAVENOUS | Status: AC
  Administered 2017-04-25: 20 mg via INTRAVENOUS
  Filled 2017-04-25: qty 50

## 2017-04-25 MED ORDER — ONDANSETRON HCL 4 MG/2ML IJ SOLN
4.0000 mg | Freq: Once | INTRAMUSCULAR | Status: AC
  Administered 2017-04-25: 4 mg via INTRAVENOUS
  Filled 2017-04-25: qty 2

## 2017-04-25 MED ORDER — MORPHINE SULFATE (PF) 4 MG/ML IV SOLN
2.0000 mg | Freq: Once | INTRAVENOUS | Status: AC
  Administered 2017-04-25: 2 mg via INTRAVENOUS
  Filled 2017-04-25: qty 1

## 2017-04-25 MED ORDER — MORPHINE SULFATE (PF) 4 MG/ML IV SOLN
4.0000 mg | Freq: Once | INTRAVENOUS | Status: AC
  Administered 2017-04-25: 4 mg via INTRAVENOUS
  Filled 2017-04-25: qty 1

## 2017-04-25 MED ORDER — ONDANSETRON HCL 4 MG PO TABS: 4.0000 mg | ORAL_TABLET | Freq: Three times a day (TID) | ORAL | 0 refills | Status: DC | PRN

## 2017-04-25 MED ORDER — FAMOTIDINE 20 MG PO TABS: 20.0000 mg | ORAL_TABLET | Freq: Two times a day (BID) | ORAL | 0 refills | Status: DC

## 2017-04-25 NOTE — ED Triage Notes (Signed)
Pt arrives with complaints of midepigastric pain and states right shoulder pain tingling.  Pt was recently told this week she has gallstones but they should not be a problem. Pt is actively vomiting.

## 2017-04-25 NOTE — ED Provider Notes (Signed)
[facility] EMERGENCY DEPARTMENT Provider Note   CSN: [id number] Arrival date & time: 04/25/17  1101     History   Chief Complaint Chief Complaint  Patient presents with  . Abdominal Pain  . Emesis    HPI Cynthia English is a 32 y.o. female.  32 year old female with prior history of C-section, gallstones, and GERD presents with complaint of epigastric abdominal pain.  Patient reports that over the last week she has had epigastric burning pain.  This is worsened today.  She was seen twice by her regular doctor.  She had an ultrasound done on Thursday as an outpatient which showed gallstones but no cholecystitis - per her report.  She presents today with complaint of increased pain in the epigastrium and associated nausea and vomiting.  Patient denies any fevers.  Patient denies lower abdominal pain.  She denies vaginal bleeding, vaginal discharge, urinary symptoms, back pain, chest pain, shortness of breath, or other complaint.  Patient does report a prior history of GERD which on - at least one occasion - required a visit to the ED.  She reports a recent C-section in January for delivery of her first child.  Patient reports minimal to no complications with that delivery.  She denies any other prior abdominal surgeries.  Patient is breast-feeding currently -she does request narcotics and antiemetics and plans to "pump and dump."   The history is provided by the patient and the spouse.  Abdominal Pain   This is a new problem. The current episode started more than 2 days ago. The problem occurs rarely. The problem has not changed since onset.The pain is located in the epigastric region. The pain is moderate. Associated symptoms include nausea and vomiting. Pertinent negatives include fever. Nothing aggravates the symptoms. Nothing relieves the symptoms.    Past Medical History:  Diagnosis Date  . Kidney stones   . Newborn product of in vitro fertilization (IVF) pregnancy      Patient Active Problem List   Diagnosis Date Noted  . S/P cesarean section 02/05/2017  . Pregnancy 02/04/2017  . Palpitations 11/10/2016    Past Surgical History:  Procedure Laterality Date  . CESAREAN SECTION N/A 02/05/2017   Procedure: CESAREAN SECTION;  Surgeon: Mitchel Honour, DO;  Location: [facility];  Service: Obstetrics;  Laterality: N/A;  . DILATION AND CURETTAGE OF UTERUS    . FOOT SURGERY Bilateral   . PLANTAR FASCIA RELEASE    . toe rotation  2004     OB History    Gravida  2   Para  1   Term  1   Preterm      AB  1   Living  1     SAB  1   TAB      Ectopic      Multiple  0   Live Births  1            Home Medications    Prior to Admission medications   Medication Sig Start Date End Date Taking? Authorizing Provider  acetaminophen (TYLENOL) 500 MG tablet Take 1,000 mg by mouth every 6 (six) hours as needed for moderate pain or headache.    [provider]  Docosahexaenoic Acid (PRENATAL DHA PO) Take 200 mg by mouth daily.    [provider]  HYDROmorphone (DILAUDID) 2 MG tablet Take 1 tablet (2 mg total) by mouth every 4 (four) hours as needed for moderate pain. 02/08/17   Zelphia Cairo,  MD  ibuprofen (ADVIL,MOTRIN) 600 MG tablet Take 1 tablet (600 mg total) by mouth every 6 (six) hours. 02/08/17   Zelphia Cairo, MD  Prenatal Vit-Fe Fumarate-FA (PRENATAL MULTIVITAMIN) TABS tablet Take 1 tablet by mouth daily at 12 noon. 02/08/17   Zelphia Cairo, MD    Family History Family History  Problem Relation Age of Onset  . Arrhythmia Father   . Supraventricular tachycardia Father   . Hypertension Father   . Diabetes Paternal Aunt   . Breast cancer Paternal Aunt   . Hypertension Maternal Grandmother   . Hypertension Paternal Grandmother   . Breast cancer Paternal Grandmother   . Colon cancer Paternal Grandfather     Social History Social History   Tobacco Use  . Smoking status: Never Smoker  .  Smokeless tobacco: Never Used  Substance Use Topics  . Alcohol use: No  . Drug use: No     Allergies   Percocet [oxycodone-acetaminophen] and Prednisone   Review of Systems Review of Systems  Constitutional: Negative for fever.  Gastrointestinal: Positive for abdominal pain, nausea and vomiting.  All other systems reviewed and are negative.    Physical Exam Updated Vital Signs BP 125/88 (BP Location: Left Arm)   Pulse 84   Temp 98.6 F (37 C) (Oral)   Resp (!) 22   SpO2 100%   Physical Exam  Constitutional: She is oriented to person, place, and time. She appears well-developed and well-nourished. No distress.  HENT:  Head: Normocephalic and atraumatic.  Mouth/Throat: Oropharynx is clear and moist.  Eyes: Pupils are equal, round, and reactive to light. Conjunctivae and EOM are normal.  Neck: Normal range of motion. Neck supple.  Cardiovascular: Normal rate, regular rhythm and normal heart sounds.  Pulmonary/Chest: Effort normal and breath sounds normal. No respiratory distress.  Abdominal: Soft. She exhibits no distension. There is tenderness in the epigastric area.  Musculoskeletal: Normal range of motion. She exhibits no edema or deformity.  Neurological: She is alert and oriented to person, place, and time.  Skin: Skin is warm and dry.  Psychiatric: She has a normal mood and affect.  Nursing note and vitals reviewed.    ED Treatments / Results  Labs (all labs ordered are listed, but only abnormal results are displayed) Labs Reviewed  COMPREHENSIVE METABOLIC PANEL - Abnormal; Notable for the following components:      Result Value   CO2 21 (*)    Glucose, Bld 101 (*)    AST 54 (*)    ALT 71 (*)    Alkaline Phosphatase 144 (*)    All other components within normal limits  CBC - Abnormal; Notable for the following components:   Platelets 422 (*)    All other components within normal limits  URINALYSIS, ROUTINE W REFLEX MICROSCOPIC - Abnormal; Notable for  the following components:   APPearance HAZY (*)    Leukocytes, UA TRACE (*)    Squamous Epithelial / LPF 6-30 (*)    All other components within normal limits  LIPASE, BLOOD  I-STAT BETA HCG BLOOD, ED ([facility], [facility], [facility] ONLY)    EKG EKG Interpretation  Date/Time:  Sunday April 25 2017 11:47:03 EDT Ventricular Rate:  100 PR Interval:  152 QRS Duration: 86 QT Interval:  354 QTC Calculation: 456 R Axis:   91 Text Interpretation:  Normal sinus rhythm Rightward axis Borderline ECG Confirmed by Kristine Royal [phone]) on 04/25/2017 11:53:01 AM   Radiology No results found.  Procedures Procedures (including critical care time)  Medications Ordered in ED Medications  ondansetron (ZOFRAN) injection 4 mg (4 mg Intravenous Given 04/25/17 1236)  sodium chloride 0.9 % bolus 1,000 mL (0 mLs Intravenous Stopped 04/25/17 1517)  gi cocktail (Maalox,Lidocaine,Donnatal) (30 mLs Oral Given 04/25/17 1237)  morphine 4 MG/ML injection 2 mg (2 mg Intravenous Given 04/25/17 1236)     Initial Impression / Assessment and Plan / ED Course  I have reviewed the triage vital signs and the nursing notes.  Pertinent labs & imaging results that were available during my care of the patient were reviewed by me and considered in my medical decision making (see chart for details).     1520 Dr. Donell BeersByerly of surgery is aware of case and agrees with disposition.   MDM  Screen complete  Patient is presented with epigastric abdominal discomfort.  Patient has a prior history of gallstones.  Lab work today is on the whole unremarkable with mildly elevated LFTs.  Patient reports that her prior labs showed LFTs that were also elevated to the same range.  Repeat ultrasound today does not demonstrate cholecystitis or other acute process.  Patient feels significantly improved with GI cocktail and morphine and Zofran.  Case was discussed with surgery.  Patient to be discharged home with close follow-up arranged.  Strict return  precautions given and understood.  All questions from the patient and her spouse were answered at bedside.  Final Clinical Impressions(s) / ED Diagnoses   Final diagnoses:  RUQ abdominal pain  Epigastric pain    ED Discharge Orders        Ordered    famotidine (PEPCID) 20 MG tablet  2 times daily     04/25/17 1522    ondansetron (ZOFRAN) 4 MG tablet  Every 8 hours PRN     04/25/17 1523       Wynetta FinesMessick, Sashia Campas C, MD 04/25/17 1526

## 2017-04-26 ENCOUNTER — Encounter: Payer: Self-pay | Admitting: Gastroenterology

## 2017-05-03 ENCOUNTER — Ambulatory Visit (HOSPITAL_COMMUNITY)
Admission: RE | Admit: 2017-05-03 | Discharge: 2017-05-03 | Disposition: A | Discharge status: Home / Self Care | Payer: BLUE CROSS/BLUE SHIELD | Source: Ambulatory Visit | Attending: Internal Medicine | Admitting: Internal Medicine

## 2017-05-03 ENCOUNTER — Other Ambulatory Visit (HOSPITAL_COMMUNITY): Payer: Self-pay | Admitting: Internal Medicine

## 2017-05-03 DIAGNOSIS — R932 Abnormal findings on diagnostic imaging of liver and biliary tract: Secondary | ICD-10-CM | POA: Insufficient documentation

## 2017-05-03 DIAGNOSIS — R109 Unspecified abdominal pain: Secondary | ICD-10-CM | POA: Diagnosis not present

## 2017-05-03 DIAGNOSIS — K8020 Calculus of gallbladder without cholecystitis without obstruction: Secondary | ICD-10-CM | POA: Diagnosis not present

## 2017-05-03 MED ORDER — MORPHINE SULFATE (PF) 4 MG/ML IV SOLN
5.0000 mg | Freq: Once | INTRAVENOUS | Status: AC
  Administered 2017-05-03: 5 mg via INTRAVENOUS
  Filled 2017-05-03: qty 1.3

## 2017-05-03 MED ORDER — MORPHINE SULFATE (PF) 4 MG/ML IV SOLN
INTRAVENOUS | Status: AC
Start: 2017-05-03 — End: 2017-05-03
  Filled 2017-05-03: qty 1

## 2017-05-03 MED ORDER — MORPHINE SULFATE (PF) 4 MG/ML IV SOLN
INTRAVENOUS | Status: AC
  Filled 2017-05-03: qty 1

## 2017-05-03 MED ORDER — TECHNETIUM TC 99M MEBROFENIN IV KIT
5.5000 | PACK | Freq: Once | INTRAVENOUS | Status: AC
  Administered 2017-05-03: 5.5 via INTRAVENOUS

## 2017-05-07 ENCOUNTER — Ambulatory Visit: Payer: Self-pay | Admitting: Surgery

## 2017-05-07 DIAGNOSIS — K819 Cholecystitis, unspecified: Secondary | ICD-10-CM | POA: Diagnosis not present

## 2017-05-07 NOTE — H&P (Signed)
Cynthia MackintoshErica N English Documented: 05/07/2017 2:32 PM Location: [facility] Patient #: [id number] DOB: 1985-07-17 Married / Language: English / Race: White Female  History of Present Illness (Cynthia Hayashida A. [name] MD; 05/07/2017 2:54 PM) Patient words: 32 year old woman referred for cholecystitis. She presented to the emergency room on March 31 with a complaint of epigastric abdominal pain for the preceding week this was burning in quality and had worsened in severity. She had been seen twice by her regular doctor and had an ultrasound done as an outpatient that did show gallstones but no cholecystitis. When she came to the ER, the pain had increased and was associated with nausea and vomiting but she denied fever at that time. Her labs included a CMP which did show mildly elevated AST and ALT, total bilirubin 1.2, CBC was normal. Ultrasound was repeated which confirmed gallstones but no signs of cholecystitis, common bile duct was 4.8 mm. Hepatic steatosis was also noted. She was treated with a GI cocktail morphine and Zofran which did improve her symptoms and therefore she was discharged with plans for close follow-up. She underwent a HIDA scan on April 8 which demonstrated nonvisualization of the gallbladder indicative of acute cholecystitis. Common bile duct was patent.  She actually looks pretty well today. She states that she does have times in her pain-free but depending on what she eats she will have pain that doubles her over. She has not vomited. Has not had any fevers or jaundice. Not losing weight and getting dehydrated.  Medical history notable for kidney stones, GERD. Surgical history notable for C-section in January of this year as well as English surgery and prior D&C. Family history includes arrhythmia and hypertension in her father, colon cancer in paternal grandfather. Denies alcohol, tobacco or drug use.  The patient is a 32 year old female.   Allergies Cynthia English(Cynthia [place],  [place]; 05/07/2017 2:33 PM) PrednisoLONE Acetate *CORTICOSTEROIDS* Allergies Reconciled  Medication History Cynthia English(Cynthia English, CMA; 05/07/2017 2:34 PM) Prenatal + Iron (Oral) Active. Medications Reconciled    Vitals Cynthia English(Cynthia English CMA; 05/07/2017 2:35 PM) 05/07/2017 2:34 PM Weight: 230.8 lb Height: 66in Body Surface Area: 2.13 m Body Mass Index: 37.25 kg/m  Temp.: 96.65F  Pulse: 88 (Regular)  BP: 120/70 (Sitting, Left Arm, Standard)      Physical Exam (Cynthia Molinelli A. [name] MD; 05/07/2017 2:55 PM)  The physical exam findings are as follows: Note:Gen: alert and well appearing Eye: extraocular motion intact, no scleral icterus ENT: moist mucus membranes, dentition intact Neck: no mass or thyromegaly Chest: unlabored respirations, symmetrical air entry, clear bilaterally CV: regular rate and rhythm, no pedal edema Abdomen: soft, moderately tender in epigastrium and right upper quadrant without palpable mass, nondistended. No mass or organomegaly MSK: strength symmetrical throughout, no deformity Neuro: grossly intact, normal gait Psych: normal mood and affect, appropriate insight Skin: warm and dry, no rash or lesion on limited exam    Assessment & Plan (Cynthia Mcnay A. [name] MD; 05/07/2017 2:55 PM)  CHOLECYSTITIS (K81.9) Story: Recommend proceeding with laparoscopic cholecystectomy. Discussed the technique of the surgery, risks of bleeding, infection, pain, scarring, intra-abdominal injury specifically to the common bile duct and sequelae, conversion to open surgery. Discussion or risks of blood clots, heart attack, pneumonia, stroke and death. I advised her that if she develops fever, intractable pain, vomiting or jaundice that she needs to go directly to the ER that we will get her admitted and do her surgery more quickly, otherwise we'll try to get her scheduled for next week. She expressed  understanding. Her questions were answered.

## 2017-05-07 NOTE — H&P (View-Only) (Signed)
Cynthia MackintoshErica N English Documented: 05/07/2017 2:32 PM Location: [facility] Patient #: [id number] DOB: 1985-07-17 Married / Language: English / Race: White Female  History of Present Illness (Cynthia English A. Cynthia Bonineonnor MD; 05/07/2017 2:54 PM) Patient words: 32 year old woman referred for cholecystitis. She presented to the emergency room on March 31 with a complaint of epigastric abdominal pain for the preceding week this was burning in quality and had worsened in severity. She had been seen twice by her regular doctor and had an ultrasound done as an outpatient that did show gallstones but no cholecystitis. When she came to the ER, the pain had increased and was associated with nausea and vomiting but she denied fever at that time. Her labs included a CMP which did show mildly elevated AST and ALT, total bilirubin 1.2, CBC was normal. Ultrasound was repeated which confirmed gallstones but no signs of cholecystitis, common bile duct was 4.8 mm. Hepatic steatosis was also noted. She was treated with a GI cocktail morphine and Zofran which did improve her symptoms and therefore she was discharged with plans for close follow-up. She underwent a HIDA scan on April 8 which demonstrated nonvisualization of the gallbladder indicative of acute cholecystitis. Common bile duct was patent.  She actually looks pretty well today. She states that she does have times in her pain-free but depending on what she eats she will have pain that doubles her over. She has not vomited. Has not had any fevers or jaundice. Not losing weight and getting dehydrated.  Medical history notable for kidney stones, GERD. Surgical history notable for C-section in January of this year as well as English surgery and prior D&C. Family history includes arrhythmia and hypertension in her father, colon cancer in paternal grandfather. Denies alcohol, tobacco or drug use.  The patient is a 32 year old female.   Allergies Cynthia English(Cynthia [place],  [place]; 05/07/2017 2:33 PM) PrednisoLONE Acetate *CORTICOSTEROIDS* Allergies Reconciled  Medication History Cynthia English(Cynthia English, CMA; 05/07/2017 2:34 PM) Prenatal + Iron (Oral) Active. Medications Reconciled    Vitals Cynthia English(Cynthia English CMA; 05/07/2017 2:35 PM) 05/07/2017 2:34 PM Weight: 230.8 lb Height: 66in Body Surface Area: 2.13 m Body Mass Index: 37.25 kg/m  Temp.: 96.65F  Pulse: 88 (Regular)  BP: 120/70 (Sitting, Left Arm, Standard)      Physical Exam (Cynthia English A. Cynthia Bonineonnor MD; 05/07/2017 2:55 PM)  The physical exam findings are as follows: Note:Gen: alert and well appearing Eye: extraocular motion intact, no scleral icterus ENT: moist mucus membranes, dentition intact Neck: no mass or thyromegaly Chest: unlabored respirations, symmetrical air entry, clear bilaterally CV: regular rate and rhythm, no pedal edema Abdomen: soft, moderately tender in epigastrium and right upper quadrant without palpable mass, nondistended. No mass or organomegaly MSK: strength symmetrical throughout, no deformity Neuro: grossly intact, normal gait Psych: normal mood and affect, appropriate insight Skin: warm and dry, no rash or lesion on limited exam    Assessment & Plan (Cynthia English A. Cynthia Bonineonnor MD; 05/07/2017 2:55 PM)  CHOLECYSTITIS (K81.9) Story: Recommend proceeding with laparoscopic cholecystectomy. Discussed the technique of the surgery, risks of bleeding, infection, pain, scarring, intra-abdominal injury specifically to the common bile duct and sequelae, conversion to open surgery. Discussion or risks of blood clots, heart attack, pneumonia, stroke and death. I advised her that if she develops fever, intractable pain, vomiting or jaundice that she needs to go directly to the ER that we will get her admitted and do her surgery more quickly, otherwise we'll try to get her scheduled for next week. She expressed  understanding. Her questions were answered.

## 2017-05-10 ENCOUNTER — Encounter (HOSPITAL_COMMUNITY): Payer: Self-pay | Admitting: *Deleted

## 2017-05-10 ENCOUNTER — Other Ambulatory Visit: Payer: Self-pay

## 2017-05-11 MED ORDER — DEXTROSE 5 % IV SOLN
3.0000 g | INTRAVENOUS | Status: AC
  Administered 2017-05-12: 3 g via INTRAVENOUS
  Filled 2017-05-11: qty 3

## 2017-05-12 ENCOUNTER — Ambulatory Visit (HOSPITAL_COMMUNITY)
Admission: RE | Admit: 2017-05-12 | Discharge: 2017-05-12 | Disposition: A | Discharge status: Home / Self Care | Payer: BLUE CROSS/BLUE SHIELD | Source: Ambulatory Visit | Attending: Surgery | Admitting: Surgery

## 2017-05-12 ENCOUNTER — Ambulatory Visit (HOSPITAL_COMMUNITY): Payer: BLUE CROSS/BLUE SHIELD | Admitting: Anesthesiology

## 2017-05-12 ENCOUNTER — Other Ambulatory Visit: Payer: Self-pay

## 2017-05-12 ENCOUNTER — Encounter (HOSPITAL_COMMUNITY): Payer: Self-pay | Admitting: *Deleted

## 2017-05-12 ENCOUNTER — Encounter (HOSPITAL_COMMUNITY)
Admission: RE | Disposition: A | Discharge status: Home / Self Care | Payer: Self-pay | Source: Ambulatory Visit | Attending: Surgery

## 2017-05-12 DIAGNOSIS — Z79899 Other long term (current) drug therapy: Secondary | ICD-10-CM | POA: Diagnosis not present

## 2017-05-12 DIAGNOSIS — N83209 Unspecified ovarian cyst, unspecified side: Secondary | ICD-10-CM | POA: Diagnosis not present

## 2017-05-12 DIAGNOSIS — K8010 Calculus of gallbladder with chronic cholecystitis without obstruction: Secondary | ICD-10-CM | POA: Insufficient documentation

## 2017-05-12 DIAGNOSIS — K219 Gastro-esophageal reflux disease without esophagitis: Secondary | ICD-10-CM | POA: Insufficient documentation

## 2017-05-12 DIAGNOSIS — K819 Cholecystitis, unspecified: Secondary | ICD-10-CM | POA: Diagnosis not present

## 2017-05-12 HISTORY — DX: Gastro-esophageal reflux disease without esophagitis: K21.9

## 2017-05-12 HISTORY — PX: CHOLECYSTECTOMY: SHX55

## 2017-05-12 HISTORY — DX: Anemia, unspecified: D64.9

## 2017-05-12 HISTORY — DX: Personal history of urinary calculi: Z87.442

## 2017-05-12 LAB — COMPREHENSIVE METABOLIC PANEL
ALBUMIN: 4 g/dL (ref 3.5–5.0)
ALK PHOS: 131 U/L — AB (ref 38–126)
ALT: 54 U/L (ref 14–54)
AST: 28 U/L (ref 15–41)
Anion gap: 8 (ref 5–15)
BUN: 16 mg/dL (ref 6–20)
CALCIUM: 9.3 mg/dL (ref 8.9–10.3)
CHLORIDE: 106 mmol/L (ref 101–111)
CO2: 25 mmol/L (ref 22–32)
CREATININE: 0.81 mg/dL (ref 0.44–1.00)
GFR calc non Af Amer: >60 mL/min (ref >60)
GLUCOSE: 89 mg/dL (ref 65–99)
Potassium: 4.2 mmol/L (ref 3.5–5.1)
SODIUM: 139 mmol/L (ref 135–145)
Total Bilirubin: 1 mg/dL (ref 0.3–1.2)
Total Protein: 7.8 g/dL (ref 6.5–8.1)

## 2017-05-12 LAB — CBC WITH DIFFERENTIAL/PLATELET
BASOS ABS: 0 10*3/uL (ref 0.0–0.1)
Basophils Relative: 0 %
EOS ABS: 0.2 10*3/uL (ref 0.0–0.7)
Eosinophils Relative: 2 %
HCT: 40.6 % (ref 36.0–46.0)
HEMOGLOBIN: 13.2 g/dL (ref 12.0–15.0)
LYMPHS ABS: 2.5 10*3/uL (ref 0.7–4.0)
Lymphocytes Relative: 36 %
MCH: 30.2 pg (ref 26.0–34.0)
MCHC: 32.5 g/dL (ref 30.0–36.0)
MCV: 92.9 fL (ref 78.0–100.0)
Monocytes Absolute: 0.6 10*3/uL (ref 0.1–1.0)
Monocytes Relative: 8 %
Neutro Abs: 3.7 10*3/uL (ref 1.7–7.7)
Neutrophils Relative %: 54 %
PLATELETS: 360 10*3/uL (ref 150–400)
RBC: 4.37 MIL/uL (ref 3.87–5.11)
RDW: 12.8 % (ref 11.5–15.5)
WBC: 6.9 10*3/uL (ref 4.0–10.5)

## 2017-05-12 LAB — HCG, SERUM, QUALITATIVE: Preg, Serum: NEGATIVE

## 2017-05-12 SURGERY — LAPAROSCOPIC CHOLECYSTECTOMY
Anesthesia: General

## 2017-05-12 MED ORDER — SCOPOLAMINE 1 MG/3DAYS TD PT72
MEDICATED_PATCH | TRANSDERMAL | Status: AC
  Filled 2017-05-12: qty 1

## 2017-05-12 MED ORDER — FENTANYL CITRATE (PF) 250 MCG/5ML IJ SOLN
INTRAMUSCULAR | Status: DC | PRN
  Administered 2017-05-12: 150 ug via INTRAVENOUS
  Administered 2017-05-12 (×2): 50 ug via INTRAVENOUS

## 2017-05-12 MED ORDER — ONDANSETRON HCL 4 MG/2ML IJ SOLN
INTRAMUSCULAR | Status: AC
  Filled 2017-05-12: qty 2

## 2017-05-12 MED ORDER — CHLORHEXIDINE GLUCONATE 4 % EX LIQD: 60.0000 mL | Freq: Once | CUTANEOUS | Status: DC

## 2017-05-12 MED ORDER — SUGAMMADEX SODIUM 500 MG/5ML IV SOLN
INTRAVENOUS | Status: AC
  Filled 2017-05-12: qty 5

## 2017-05-12 MED ORDER — IOPAMIDOL (ISOVUE-300) INJECTION 61%
INTRAVENOUS | Status: AC
  Filled 2017-05-12: qty 50

## 2017-05-12 MED ORDER — SUGAMMADEX SODIUM 200 MG/2ML IV SOLN
INTRAVENOUS | Status: DC | PRN
  Administered 2017-05-12: 220 mg via INTRAVENOUS

## 2017-05-12 MED ORDER — SODIUM CHLORIDE 0.9 % IR SOLN
Status: DC | PRN
  Administered 2017-05-12: 1000 mL

## 2017-05-12 MED ORDER — ONDANSETRON HCL 4 MG/2ML IJ SOLN
INTRAMUSCULAR | Status: DC | PRN
  Administered 2017-05-12: 4 mg via INTRAVENOUS

## 2017-05-12 MED ORDER — ONDANSETRON HCL 4 MG/2ML IJ SOLN
4.0000 mg | Freq: Once | INTRAMUSCULAR | Status: AC | PRN
  Administered 2017-05-12: 4 mg via INTRAVENOUS

## 2017-05-12 MED ORDER — DOCUSATE SODIUM 100 MG PO CAPS: 100.0000 mg | ORAL_CAPSULE | Freq: Two times a day (BID) | ORAL | 0 refills | Status: AC

## 2017-05-12 MED ORDER — PROPOFOL 10 MG/ML IV BOLUS
INTRAVENOUS | Status: DC | PRN
  Administered 2017-05-12: 120 mg via INTRAVENOUS

## 2017-05-12 MED ORDER — DEXAMETHASONE SODIUM PHOSPHATE 10 MG/ML IJ SOLN
INTRAMUSCULAR | Status: AC
  Filled 2017-05-12: qty 1

## 2017-05-12 MED ORDER — GABAPENTIN 300 MG PO CAPS
300.0000 mg | ORAL_CAPSULE | ORAL | Status: AC
  Administered 2017-05-12: 300 mg via ORAL
  Filled 2017-05-12: qty 1

## 2017-05-12 MED ORDER — LIDOCAINE 2% (20 MG/ML) 5 ML SYRINGE
INTRAMUSCULAR | Status: DC | PRN
  Administered 2017-05-12: 100 mg via INTRAVENOUS

## 2017-05-12 MED ORDER — ACETAMINOPHEN 500 MG PO TABS
1000.0000 mg | ORAL_TABLET | ORAL | Status: AC
  Administered 2017-05-12: 1000 mg via ORAL
  Filled 2017-05-12: qty 2

## 2017-05-12 MED ORDER — CELECOXIB 200 MG PO CAPS
200.0000 mg | ORAL_CAPSULE | ORAL | Status: AC
  Administered 2017-05-12: 200 mg via ORAL
  Filled 2017-05-12: qty 1

## 2017-05-12 MED ORDER — ROCURONIUM BROMIDE 10 MG/ML (PF) SYRINGE
PREFILLED_SYRINGE | INTRAVENOUS | Status: DC | PRN
  Administered 2017-05-12: 50 mg via INTRAVENOUS
  Administered 2017-05-12: 10 mg via INTRAVENOUS

## 2017-05-12 MED ORDER — PHENYLEPHRINE HCL 10 MG/ML IJ SOLN
INTRAMUSCULAR | Status: AC
  Filled 2017-05-12: qty 1

## 2017-05-12 MED ORDER — PHENYLEPHRINE 40 MCG/ML (10ML) SYRINGE FOR IV PUSH (FOR BLOOD PRESSURE SUPPORT)
PREFILLED_SYRINGE | INTRAVENOUS | Status: DC | PRN
  Administered 2017-05-12: 80 ug via INTRAVENOUS

## 2017-05-12 MED ORDER — GLYCOPYRROLATE 0.2 MG/ML IV SOSY
PREFILLED_SYRINGE | INTRAVENOUS | Status: DC | PRN
  Administered 2017-05-12: .3 mg via INTRAVENOUS

## 2017-05-12 MED ORDER — BUPIVACAINE-EPINEPHRINE 0.25% -1:200000 IJ SOLN
INTRAMUSCULAR | Status: DC | PRN
  Administered 2017-05-12: 20 mL

## 2017-05-12 MED ORDER — OXYCODONE HCL 5 MG PO TABS: 5.0000 mg | ORAL_TABLET | Freq: Four times a day (QID) | ORAL | 0 refills | Status: AC | PRN

## 2017-05-12 MED ORDER — DEXAMETHASONE SODIUM PHOSPHATE 10 MG/ML IJ SOLN
INTRAMUSCULAR | Status: DC | PRN
  Administered 2017-05-12: 10 mg via INTRAVENOUS

## 2017-05-12 MED ORDER — FENTANYL CITRATE (PF) 250 MCG/5ML IJ SOLN
INTRAMUSCULAR | Status: AC
  Filled 2017-05-12: qty 5

## 2017-05-12 MED ORDER — MEPERIDINE HCL 50 MG/ML IJ SOLN: 6.2500 mg | INTRAMUSCULAR | Status: DC | PRN

## 2017-05-12 MED ORDER — GLYCOPYRROLATE 0.2 MG/ML IV SOSY
PREFILLED_SYRINGE | INTRAVENOUS | Status: AC
  Filled 2017-05-12: qty 5

## 2017-05-12 MED ORDER — HYDROMORPHONE HCL 1 MG/ML IJ SOLN
0.2500 mg | INTRAMUSCULAR | Status: DC | PRN
  Administered 2017-05-12 (×3): 0.5 mg via INTRAVENOUS

## 2017-05-12 MED ORDER — BUPIVACAINE-EPINEPHRINE (PF) 0.5% -1:200000 IJ SOLN
INTRAMUSCULAR | Status: AC
  Filled 2017-05-12: qty 30

## 2017-05-12 MED ORDER — PROPOFOL 10 MG/ML IV BOLUS
INTRAVENOUS | Status: AC
  Filled 2017-05-12: qty 20

## 2017-05-12 MED ORDER — ROCURONIUM BROMIDE 10 MG/ML (PF) SYRINGE
PREFILLED_SYRINGE | INTRAVENOUS | Status: AC
  Filled 2017-05-12: qty 5

## 2017-05-12 MED ORDER — LIDOCAINE 2% (20 MG/ML) 5 ML SYRINGE
INTRAMUSCULAR | Status: AC
  Filled 2017-05-12: qty 5

## 2017-05-12 MED ORDER — MIDAZOLAM HCL 2 MG/2ML IJ SOLN
INTRAMUSCULAR | Status: AC
  Filled 2017-05-12: qty 2

## 2017-05-12 MED ORDER — SCOPOLAMINE 1 MG/3DAYS TD PT72: 1.0000 | MEDICATED_PATCH | TRANSDERMAL | Status: DC

## 2017-05-12 MED ORDER — HYDROMORPHONE HCL 1 MG/ML IJ SOLN
INTRAMUSCULAR | Status: AC
  Filled 2017-05-12: qty 2

## 2017-05-12 MED ORDER — LACTATED RINGERS IV SOLN
INTRAVENOUS | Status: DC
  Administered 2017-05-12: 10:00:00 via INTRAVENOUS

## 2017-05-12 MED ORDER — MIDAZOLAM HCL 2 MG/2ML IJ SOLN
INTRAMUSCULAR | Status: DC | PRN
  Administered 2017-05-12: 2 mg via INTRAVENOUS

## 2017-05-12 MED FILL — oxyCODONE HCL 5 MG TABS: 5 | 5 days supply | Qty: 20 | Fill #0

## 2017-05-12 SURGICAL SUPPLY — 36 items
ADH SKN CLS APL DERMABOND .7 (GAUZE/BANDAGES/DRESSINGS) ×1
APPLIER CLIP ROT 10 11.4 M/L (STAPLE) ×2
APR CLP MED LRG 11.4X10 (STAPLE) ×1
BAG SPEC RTRVL LRG 6X4 10 (ENDOMECHANICALS) ×1
CABLE HIGH FREQUENCY MONO STRZ (ELECTRODE) ×2 IMPLANT
CHLORAPREP W/TINT 26ML (MISCELLANEOUS) ×2 IMPLANT
CLIP APPLIE ROT 10 11.4 M/L (STAPLE) ×1 IMPLANT
COVER MAYO STAND STRL (DRAPES) IMPLANT
COVER SURGICAL LIGHT HANDLE (MISCELLANEOUS) ×2 IMPLANT
DECANTER SPIKE VIAL GLASS SM (MISCELLANEOUS) ×2 IMPLANT
DERMABOND ADVANCED (GAUZE/BANDAGES/DRESSINGS) ×1
DERMABOND ADVANCED .7 DNX12 (GAUZE/BANDAGES/DRESSINGS) ×1 IMPLANT
DEVICE PMI PUNCTURE CLOSURE (MISCELLANEOUS) ×1 IMPLANT
DRAPE C-ARM 42X120 X-RAY (DRAPES) IMPLANT
ELECT REM PT RETURN 15FT ADLT (MISCELLANEOUS) ×2 IMPLANT
GLOVE BIO SURGEON STRL SZ 6 (GLOVE) ×2 IMPLANT
GLOVE INDICATOR 6.5 STRL GRN (GLOVE) ×2 IMPLANT
GOWN STRL REUS W/TWL LRG LVL3 (GOWN DISPOSABLE) ×2 IMPLANT
GOWN STRL REUS W/TWL XL LVL3 (GOWN DISPOSABLE) ×4 IMPLANT
GRASPER SUT TROCAR 14GX15 (MISCELLANEOUS) ×2 IMPLANT
HEMOSTAT SNOW SURGICEL 2X4 (HEMOSTASIS) IMPLANT
KIT BASIN OR (CUSTOM PROCEDURE TRAY) ×2 IMPLANT
NDL INSUFFLATION 14GA 120MM (NEEDLE) ×1 IMPLANT
NEEDLE INSUFFLATION 14GA 120MM (NEEDLE) ×2 IMPLANT
POUCH SPECIMEN RETRIEVAL 10MM (ENDOMECHANICALS) ×2 IMPLANT
SCISSORS LAP 5X35 DISP (ENDOMECHANICALS) ×2 IMPLANT
SET CHOLANGIOGRAPH MIX (MISCELLANEOUS) IMPLANT
SET IRRIG TUBING LAPAROSCOPIC (IRRIGATION / IRRIGATOR) ×2 IMPLANT
SLEEVE XCEL OPT CAN 5 100 (ENDOMECHANICALS) ×4 IMPLANT
SUT MNCRL AB 4-0 PS2 18 (SUTURE) ×2 IMPLANT
TOWEL OR 17X26 10 PK STRL BLUE (TOWEL DISPOSABLE) ×2 IMPLANT
TOWEL OR NON WOVEN STRL DISP B (DISPOSABLE) IMPLANT
TRAY LAPAROSCOPIC (CUSTOM PROCEDURE TRAY) ×2 IMPLANT
TROCAR BLADELESS OPT 5 100 (ENDOMECHANICALS) ×2 IMPLANT
TROCAR XCEL 12X100 BLDLESS (ENDOMECHANICALS) ×2 IMPLANT
TUBING INSUF HEATED (TUBING) ×2 IMPLANT

## 2017-05-12 NOTE — Anesthesia Postprocedure Evaluation (Signed)
Anesthesia Post Note  Patient: Margaree Mackintoshrica N Hodsdon  Procedure(s) Performed: LAPAROSCOPIC CHOLECYSTECTOMY (N/A )     Patient location during evaluation: PACU Anesthesia Type: General Level of consciousness: awake and alert Pain management: pain level controlled Vital Signs Assessment: post-procedure vital signs reviewed and stable Respiratory status: spontaneous breathing, nonlabored ventilation, respiratory function stable and patient connected to nasal cannula oxygen Cardiovascular status: blood pressure returned to baseline and stable Postop Assessment: no apparent nausea or vomiting Anesthetic complications: no    Last Vitals:  Vitals:   05/12/17 1425 05/12/17 1448  BP: 131/87 124/77  Pulse: 94 94  Resp: 14 14  Temp: 36.9 C   SpO2:      Last Pain:  Vitals:   05/12/17 1448  TempSrc:   PainSc: 3                  Dana Debo DAVID

## 2017-05-12 NOTE — Anesthesia Procedure Notes (Signed)
Procedure Name: Intubation Date/Time: 05/12/2017 11:41 AM Performed by: Florene Routeeardon, Amonte Brookover L, CRNA Patient Re-evaluated:Patient Re-evaluated prior to induction Oxygen Delivery Method: Circle system utilized Preoxygenation: Pre-oxygenation with 100% oxygen Induction Type: IV induction Ventilation: Mask ventilation without difficulty and Oral airway inserted - appropriate to patient size Laryngoscope Size: Hyacinth MeekerMiller and 2 Grade View: Grade I Tube type: Oral Tube size: 7.5 mm Number of attempts: 1 Airway Equipment and Method: Stylet Placement Confirmation: ETT inserted through vocal cords under direct vision,  positive ETCO2 and breath sounds checked- equal and bilateral Secured at: 21 cm Tube secured with: Tape Dental Injury: Teeth and Oropharynx as per pre-operative assessment

## 2017-05-12 NOTE — Anesthesia Preprocedure Evaluation (Signed)
Anesthesia Evaluation  Patient identified by MRN, date of birth, ID band Patient awake    Reviewed: Allergy & Precautions, NPO status , Patient's Chart, lab work & pertinent test results  Airway Mallampati: II  TM Distance: >3 FB Neck ROM: Full    Dental   Pulmonary    Pulmonary exam normal        Cardiovascular Normal cardiovascular exam     Neuro/Psych    GI/Hepatic GERD  Medicated and Controlled,  Endo/Other    Renal/GU      Musculoskeletal   Abdominal   Peds  Hematology   Anesthesia Other Findings   Reproductive/Obstetrics                             Anesthesia Physical Anesthesia Plan  ASA: II  Anesthesia Plan: General   Post-op Pain Management:    Induction: Intravenous  PONV Risk Score and Plan: 2 and Ondansetron, Dexamethasone and Midazolam  Airway Management Planned: Oral ETT  Additional Equipment:   Intra-op Plan:   Post-operative Plan: Extubation in OR  Informed Consent: I have reviewed the patients History and Physical, chart, labs and discussed the procedure including the risks, benefits and alternatives for the proposed anesthesia with the patient or authorized representative who has indicated his/her understanding and acceptance.     Plan Discussed with: CRNA and Surgeon  Anesthesia Plan Comments:         Anesthesia Quick Evaluation

## 2017-05-12 NOTE — Interval H&P Note (Signed)
History and Physical Interval Note:  05/12/2017 9:56 AM  Cynthia English  has presented today for surgery, with the diagnosis of Cholecystitis  The various methods of treatment have been discussed with the patient and family. After consideration of risks, benefits and other options for treatment, the patient has consented to  Procedure(s): LAPAROSCOPIC CHOLECYSTECTOMY (N/A) as a surgical intervention .  The patient's history has been reviewed, patient examined, no change in status, stable for surgery.  I have reviewed the patient's chart and labs.  Questions were answered to the patient's satisfaction.     [name]

## 2017-05-12 NOTE — Discharge Instructions (Signed)
LAPAROSCOPIC SURGERY: POST OP INSTRUCTIONS  ######################################################################  EAT Gradually transition to a high fiber diet with a fiber supplement over the next few weeks after discharge.  Start with a pureed / full liquid diet (see below)  WALK Walk an hour a day.  Control your pain to do that.    CONTROL PAIN Control pain so that you can walk, sleep, tolerate sneezing/coughing, go up/down stairs.  HAVE A BOWEL MOVEMENT DAILY Keep your bowels regular to avoid problems.  OK to try a laxative to override constipation.  OK to use an antidairrheal to slow down diarrhea.  Call if not better after 2 tries  CALL IF YOU HAVE PROBLEMS/CONCERNS Call if you are still struggling despite following these instructions. Call if you have concerns not answered by these instructions  ######################################################################    1. DIET: Follow a light bland diet the first 24 hours after arrival home, such as soup, liquids, crackers, etc.  Be sure to include lots of fluids daily.  Avoid fast food or heavy meals as your are more likely to get nauseated.  Eat a low fat the next few days after surgery.   2. Take your usually prescribed home medications unless otherwise directed. 3. PAIN CONTROL: a. Pain is best controlled by a usual combination of three different methods TOGETHER: i. Ice/Heat ii. Over the counter pain medication iii. Prescription pain medication b. Most patients will experience some swelling and bruising around the incisions.  Ice packs or heating pads (30-60 minutes up to 6 times a day) will help. Use ice for the first few days to help decrease swelling and bruising, then switch to heat to help relax tight/sore spots and speed recovery.  Some people prefer to use ice alone, heat alone, alternating between ice & heat.  Experiment to what works for you.  Swelling and bruising can take several weeks to resolve.   c. It is  helpful to take an over-the-counter pain medication regularly for the first few weeks.  Choose one of the following that works best for you: i. Naproxen (Aleve, etc)  Two 251m tabs twice a day ii. Ibuprofen (Advil, etc) Three 2053mtabs four times a day (every meal & bedtime) iii. Acetaminophen (Tylenol, etc) 500-65044mour times a day (every meal & bedtime) d. A  prescription for pain medication (such as oxycodone, hydrocodone, etc) should be given to you upon discharge.  Take your pain medication as prescribed.  i. If you are having problems/concerns with the prescription medicine (does not control pain, nausea, vomiting, rash, itching, etc), please call us [phone] see if we need to switch you to a different pain medicine that will work better for you and/or control your side effect better. ii. If you need a refill on your pain medication, please contact your pharmacy.  They will contact our office to request authorization. Prescriptions will not be filled after 5 pm or on week-ends. 4. Avoid getting constipated.  Between the surgery and the pain medications, it is common to experience some constipation.  Increasing fluid intake and taking a fiber supplement (such as Metamucil, Citrucel, FiberCon, MiraLax, etc) 1-2 times a day regularly will usually help prevent this problem from occurring.  A mild laxative (prune juice, Milk of Magnesia, MiraLax, etc) should be taken according to package directions if there are no bowel movements after 48 hours.   5. Watch out for diarrhea.  If you have many loose bowel movements, simplify your diet to bland foods & liquids for  a few days.  Stop any stool softeners and decrease your fiber supplement.  Switching to mild anti-diarrheal medications (Kayopectate, Pepto Bismol) can help.  If this worsens or does not improve, please call us. 6. Wash / shower every day.  You may shower over the skin glue which is waterproof.  No rubbing, scrubbing, lotions or  ointments to incisions. 7. Skin glue will flake off after 2-3 weeks.  You may leave the incision open to air.  You may replace a dressing/Band-Aid to cover the incision for comfort if you wish.  8. ACTIVITIES as tolerated:   a. You may resume regular (light) daily activities beginning the next day--such as daily self-care, walking, climbing stairs--gradually increasing activities as tolerated.  If you can walk 30 minutes without difficulty, it is safe to try more intense activity such as jogging, treadmill, bicycling, low-impact aerobics, swimming, etc. b. Save the most intensive and strenuous activity for last such as sit-ups, heavy lifting, contact sports, etc  Refrain from any heavy lifting or straining until you are off narcotics for pain control.   c. DO NOT PUSH THROUGH PAIN.  Let pain be your guide: If it hurts to do something, don't do it.  Pain is your body warning you to avoid that activity for another week until the pain goes down. d. You may drive when you are no longer taking prescription pain medication, you can comfortably wear a seatbelt, and you can safely maneuver your car and apply brakes. e. Bonita Quin may have sexual intercourse when it is comfortable.  9. FOLLOW UP in our office a. Please call [facility] at [phone] to set up an appointment to see your surgeon in the office for a follow-up appointment approximately 2-3 weeks after your surgery. b. Make sure that you call for this appointment the day you arrive home to insure a convenient appointment time. 10. IF YOU HAVE DISABILITY OR FAMILY LEAVE FORMS, BRING THEM TO THE OFFICE FOR PROCESSING.  DO NOT GIVE THEM TO YOUR DOCTOR.   WHEN TO CALL us [phone]: 1. Poor pain control 2. Reactions / problems with new medications (rash/itching, nausea, etc)  3. Fever over 101.5 F (38.5 C) 4. Inability to urinate 5. Nausea and/or vomiting 6. Worsening swelling or bruising 7. Continued bleeding from incision. 8. Increased pain, redness,  or drainage from the incision   The clinic staff is available to answer your questions during regular business hours (8:30am-5pm).  Please dont hesitate to call and ask to speak to one of our nurses for clinical concerns.   If you have a medical emergency, go to the nearest emergency room or call 911.  A surgeon from [facility] is always on call at the [facility], [place] [address], [address], [place], [place]  [address] ? MAIN: [phone] ? TOLL FREE: [phone] ?  FAX [phone] [email or web address]    Laparoscopic Cholecystectomy Laparoscopic cholecystectomy is surgery to remove the gallbladder. The gallbladder is a pear-shaped organ that lies beneath the liver on the right side of the body. The gallbladder stores bile, which is a fluid that helps the body to digest fats. Cholecystectomy is often done for inflammation of the gallbladder (cholecystitis). This condition is usually caused by a buildup of gallstones (cholelithiasis) in the gallbladder. Gallstones can block the flow of bile, which can result in inflammation and pain. In severe cases, emergency surgery may be required. This procedure is done though small incisions in your abdomen (  laparoscopic surgery). A thin scope with a camera (laparoscope) is inserted through one incision. Thin surgical instruments are inserted through the other incisions. In some cases, a laparoscopic procedure may be turned into a type of surgery that is done through a larger incision (open surgery). Tell a health care provider about:  Any allergies you have.  All medicines you are taking, including vitamins, herbs, eye drops, creams, and over-the-counter medicines.  Any problems you or family members have had with anesthetic medicines.  Any blood disorders you have.  Any surgeries you have had.  Any medical conditions you have.  Whether you are pregnant or may be pregnant. What  are the risks? Generally, this is a safe procedure. However, problems may occur, including:  Infection.  Bleeding.  Allergic reactions to medicines.  Damage to other structures or organs.  A stone remaining in the common bile duct. The common bile duct carries bile from the gallbladder into the small intestine.  A bile leak from the cyst duct that is clipped when your gallbladder is removed.  What happens before the procedure? Staying hydrated Follow instructions from your health care provider about hydration, which may include:  Up to 2 hours before the procedure - you may continue to drink clear liquids, such as water, clear fruit juice, black coffee, and plain tea.  Eating and drinking restrictions Follow instructions from your health care provider about eating and drinking, which may include:  8 hours before the procedure - stop eating heavy meals or foods such as meat, fried foods, or fatty foods.  6 hours before the procedure - stop eating light meals or foods, such as toast or cereal.  6 hours before the procedure - stop drinking milk or drinks that contain milk.  2 hours before the procedure - stop drinking clear liquids.  Medicines  Ask your health care provider about: ? Changing or stopping your regular medicines. This is especially important if you are taking diabetes medicines or blood thinners. ? Taking medicines such as aspirin and ibuprofen. These medicines can thin your blood. Do not take these medicines before your procedure if your health care provider instructs you not to.  You may be given antibiotic medicine to help prevent infection. General instructions  Let your health care provider know if you develop a cold or an infection before surgery.  Plan to have someone take you home from the hospital or clinic.  Ask your health care provider how your surgical site will be marked or identified. What happens during the procedure?  To reduce your risk of  infection: ? Your health care team will wash or sanitize their hands. ? Your skin will be washed with soap. ? Hair may be removed from the surgical area.  An IV tube may be inserted into one of your veins.  You will be given one or more of the following: ? A medicine to help you relax (sedative). ? A medicine to make you fall asleep (general anesthetic).  A breathing tube will be placed in your mouth.  Your surgeon will make several small cuts (incisions) in your abdomen.  The laparoscope will be inserted through one of the small incisions. The camera on the laparoscope will send images to a TV screen (monitor) in the operating room. This lets your surgeon see inside your abdomen.  Air-like gas will be pumped into your abdomen. This will expand your abdomen to give the surgeon more room to perform the surgery.  Other tools that are  needed for the procedure will be inserted through the other incisions. The gallbladder will be removed through one of the incisions.  Your common bile duct may be examined. If stones are found in the common bile duct, they may be removed.  After your gallbladder has been removed, the incisions will be closed with stitches (sutures), staples, or skin glue.  Your incisions may be covered with a bandage (dressing). The procedure may vary among health care providers and hospitals. What happens after the procedure?  Your blood pressure, heart rate, breathing rate, and blood oxygen level will be monitored until the medicines you were given have worn off.  You will be given medicines as needed to control your pain.  Do not drive for 24 hours if you were given a sedative. This information is not intended to replace advice given to you by your health care provider. Make sure you discuss any questions you have with your health care provider. Document Released: 01/12/2005 Document Revised: 08/04/2015 Document Reviewed: 07/01/2015 Elsevier Interactive Patient  Education  2018 [employer].  General Anesthesia, Adult, Care After These instructions provide you with information about caring for yourself after your procedure. Your health care provider may also give you more specific instructions. Your treatment has been planned according to current medical practices, but problems sometimes occur. Call your health care provider if you have any problems or questions after your procedure. What can I expect after the procedure? After the procedure, it is common to have:  Vomiting.  A sore throat.  Mental slowness.  It is common to feel:  Nauseous.  Cold or shivery.  Sleepy.  Tired.  Sore or achy, even in parts of your body where you did not have surgery.  Follow these instructions at home: For at least 24 hours after the procedure:  Do not: ? Participate in activities where you could fall or become injured. ? Drive. ? Use heavy machinery. ? Drink alcohol. ? Take sleeping pills or medicines that cause drowsiness. ? Make important decisions or sign legal documents. ? Take care of children on your own.  Rest. Eating and drinking  If you vomit, drink water, juice, or soup when you can drink without vomiting.  Drink enough fluid to keep your urine clear or pale yellow.  Make sure you have little or no nausea before eating solid foods.  Follow the diet recommended by your health care provider. General instructions  Have a responsible adult stay with you until you are awake and alert.  Return to your normal activities as told by your health care provider. Ask your health care provider what activities are safe for you.  Take over-the-counter and prescription medicines only as told by your health care provider.  If you smoke, do not smoke without supervision.  Keep all follow-up visits as told by your health care provider. This is important. Contact a health care provider if:  You continue to have nausea or vomiting at home, and  medicines are not helpful.  You cannot drink fluids or start eating again.  You cannot urinate after 8-12 hours.  You develop a skin rash.  You have fever.  You have increasing redness at the site of your procedure. Get help right away if:  You have difficulty breathing.  You have chest pain.  You have unexpected bleeding.  You feel that you are having a life-threatening or urgent problem. This information is not intended to replace advice given to you by your health care provider. Make  sure you discuss any questions you have with your health care provider. Document Released: 04/20/2000 Document Revised: 06/17/2015 Document Reviewed: 12/27/2014 Elsevier Interactive Patient Education  [employer].

## 2017-05-12 NOTE — Transfer of Care (Signed)
Immediate Anesthesia Transfer of Care Note  Patient: Cynthia English  Procedure(s) Performed: LAPAROSCOPIC CHOLECYSTECTOMY (N/A )  Patient Location: PACU  Anesthesia Type:General  Level of Consciousness: awake  Airway & Oxygen Therapy: Patient Spontanous Breathing and Patient connected to face mask oxygen  Post-op Assessment: Report given to RN and Post -op Vital signs reviewed and stable  Post vital signs: Reviewed and stable  Last Vitals:  Vitals Value Taken Time  BP    Temp    Pulse 108 05/12/2017 12:54 PM  Resp 19 05/12/2017 12:54 PM  SpO2 100 % 05/12/2017 12:54 PM  Vitals shown include unvalidated device data.  Last Pain:  Vitals:   05/12/17 0857  TempSrc: Oral         Complications: No apparent anesthesia complications

## 2017-05-12 NOTE — Op Note (Signed)
Operative Note  Cynthia English 32 y.o. female [id number]  05/12/2017  Surgeon: Berna Buehelsea A Maxum Cassarino MD  Assistant: OR staff  Procedure performed: Laparoscopic Cholecystectomy  Preop diagnosis: cholecystitis Post-op diagnosis/intraop findings: same, ovarian cyst L>R (photos in media tab) with small amount of simple fluid in pelvis, redundant sigmoid and dilated rectum  Specimens: gallbladder  EBL: minimal  Complications: none  Description of procedure: After obtaining informed consent the patient was brought to the operating room. Prophylactic antibiotics were administered. SCD's were applied. General endotracheal anesthesia was initiated and a formal time-out was performed. The abdomen was prepped and draped in the usual sterile fashion and the abdomen was entered using an infraumbilical veress needle after instilling the site with local. Insufflation to 15mmHg was obtained 5mm trocar and camera placed and gross inspection revealed no evidence of injury from our entry. The abdomen was surveyed with findings as listed above. Two 5mm trocars were introduced in the right midclavicular and right anterior axillary lines under direct visualization and following infiltration with local. A 12mm trocar was placed in the epigastrium. The gallbladder was retracted cephalad and the infundibulum was retracted laterally. Omental adhesions to the gallbladder were taken down with cautery and blunt dissection. A combination of hook electrocautery and blunt dissection was utilized to clear the peritoneum from the neck and cystic duct, circumferentially isolating the cystic artery and cystic duct and lifting the gallbladder from the cystic plate. The critical view of safety was achieved with the cystic artery, cystic duct, and liver bed visualized between them with no other structures. There was an additional aberrant vessel extending along the inferior aspect of the cystic duct which also clearly terminated on the  posterior aspect of the gallbladder. The cystic artery was clipped with a single clip proximally and distally and divided as was the cystic duct with three clips on the proximal end. The posterior artery was clipped with two clips proximally and divided. The gallbladder was dissected from the liver plate using electrocautery. Once freed the gallbladder was placed in an endocatch bag and removed intact through the epigastric trocar site. The right upper quadrant was irrigated and aspirated, the effluent was clear. Hemostasis was once again confirmed, and reinspection of the abdomen revealed no injuries. The clips were well opposed without any bile leak from the duct or the liver bed. The 12mm trocar site in the epigastrium was closed with a 0 vicryl in the fascia under direct visualization using a PMI device. The abdomen was desufflated and all trocars removed. The skin incisions were closed with running subcuticular monocryl and Dermabond. The patient was awakened, extubated and transported to the recovery room in stable condition.   All counts were correct at the completion of the case.

## 2017-05-17 DIAGNOSIS — B3789 Other sites of candidiasis: Secondary | ICD-10-CM | POA: Diagnosis not present

## 2017-06-08 ENCOUNTER — Ambulatory Visit: Payer: BLUE CROSS/BLUE SHIELD | Admitting: Gastroenterology

## 2017-06-28 DIAGNOSIS — D2239 Melanocytic nevi of other parts of face: Secondary | ICD-10-CM | POA: Diagnosis not present

## 2017-06-28 DIAGNOSIS — D1801 Hemangioma of skin and subcutaneous tissue: Secondary | ICD-10-CM | POA: Diagnosis not present

## 2017-06-28 DIAGNOSIS — D225 Melanocytic nevi of trunk: Secondary | ICD-10-CM | POA: Diagnosis not present

## 2017-07-19 DIAGNOSIS — J0100 Acute maxillary sinusitis, unspecified: Secondary | ICD-10-CM | POA: Diagnosis not present

## 2017-08-06 IMAGING — MR MR ABDOMEN WO/W CM
9 of 18 series · 20 of 48 positions shown · IV contrast (10 MH)
Comparison: Abdominal ultrasound 08/12/2015. Noncontrast CT scan
04/05/2014. CT the abdomen and pelvis 01/01/2014.

CLINICAL DATA: 30-year-old female with history of multiple liver
lesions noted on recent ultrasound examination, potentially new
compared to prior CT examination from 8345. Follow-up study for
characterization.

EXAM:
MRI ABDOMEN WITHOUT AND WITH CONTRAST
TECHNIQUE: Multiplanar multisequence MR imaging of the abdomen was performed
both before and after the administration of intravenous contrast.
CONTRAST:  10 mL of Eovist.

[Series 3: cor ssfse nav · coronal · 6.0mm · 0.78mm/px · 2 of 37 slices shown]
[im 1/37]
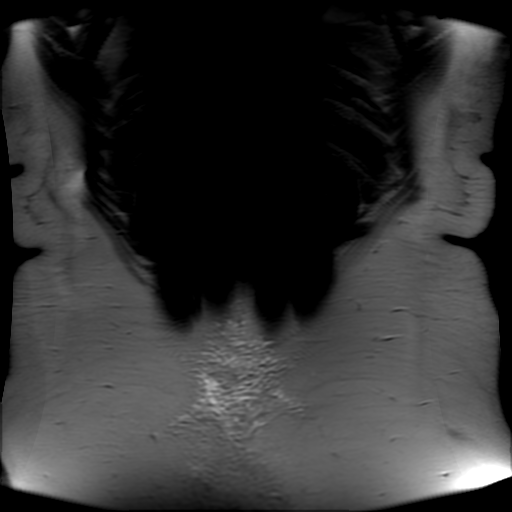
[im 37/37]
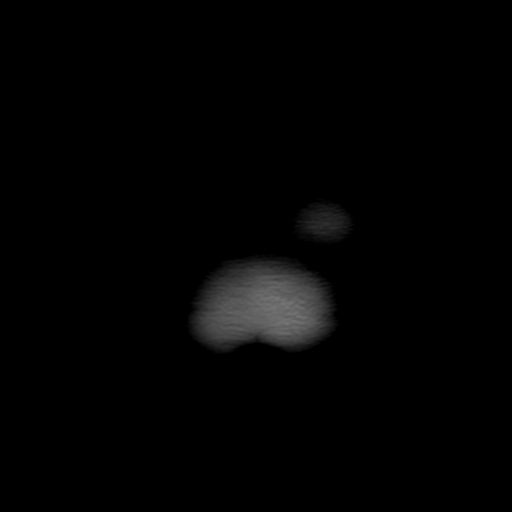

[Series 11: ax ssfse nav · axial · 6.0mm · 0.74mm/px · z∈[-28,+206]mm · 2 of 40 slices shown]
[im 1/40]
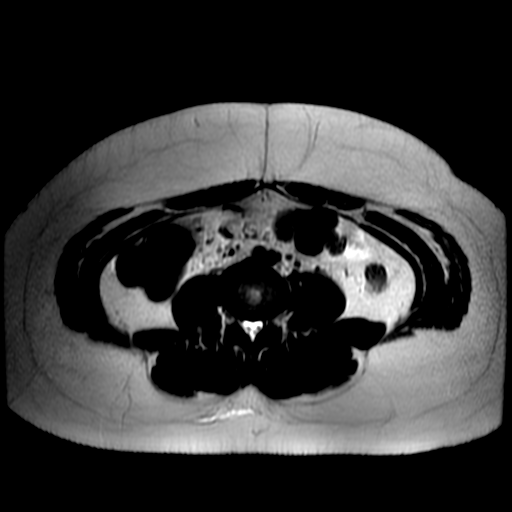
[im 40/40]
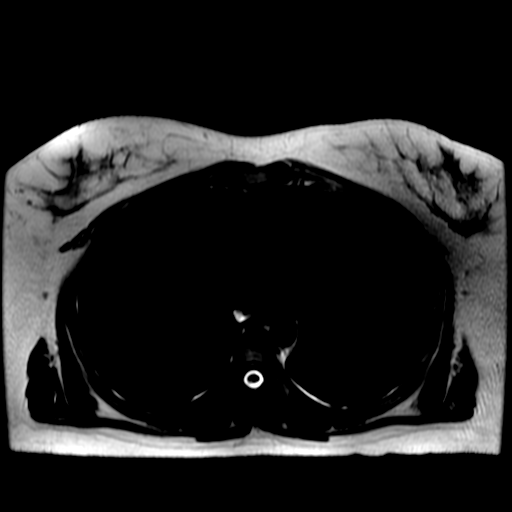

[Series 12: T2 fat-sat · axial · 6.0mm · 0.74mm/px · 1 of 40 slices shown]
[im 1/40]
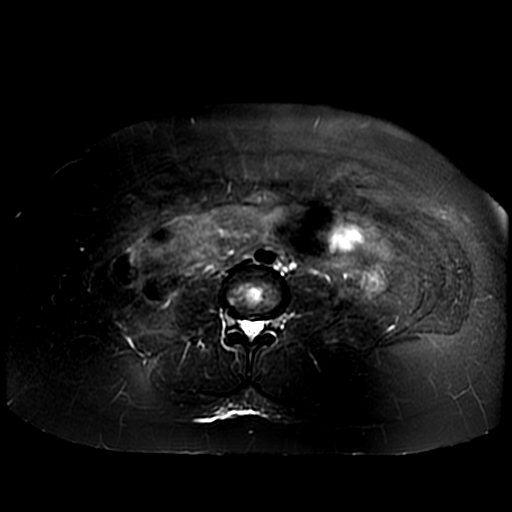

[Series 13: T1 dynamic post-contrast · axial · 4.0mm · 0.86mm/px · z∈[-49,+165]mm · 3 of 108 slices shown (1 of 3)]
[im 1/108]
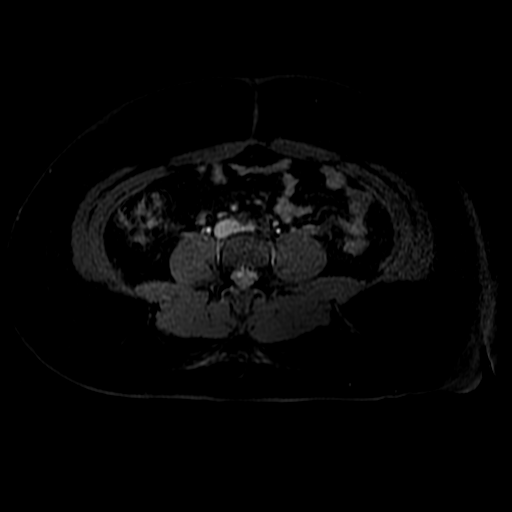
[im 54/108]
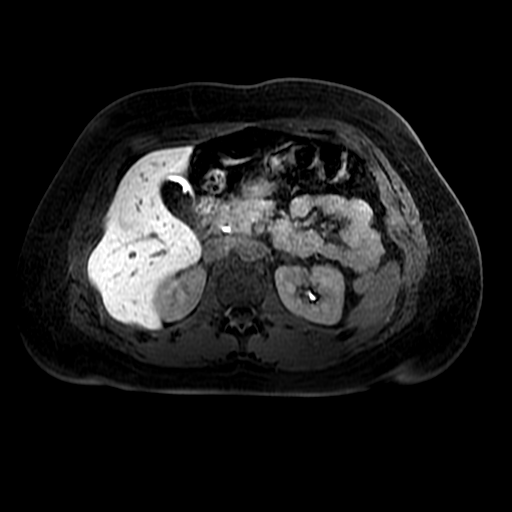
[im 108/108]
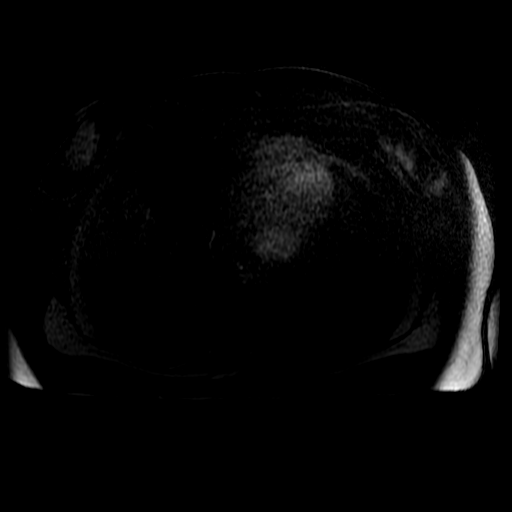

[Series 14: DWI b500 · axial · 6.0mm · 1.48mm/px · z∈[-28,+206]mm · 2 of 79 slices shown]
[im 1/79]
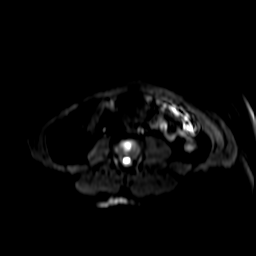
[im 79/79]
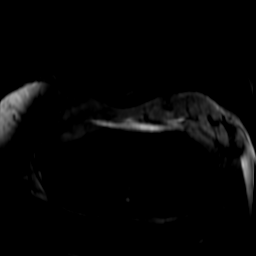

[Series 601: T1 dynamic · axial · 4.0mm · 0.82mm/px · z∈[-40,+166]mm · 3 of 104 slices shown (1 of 2)]
[im 1/104]
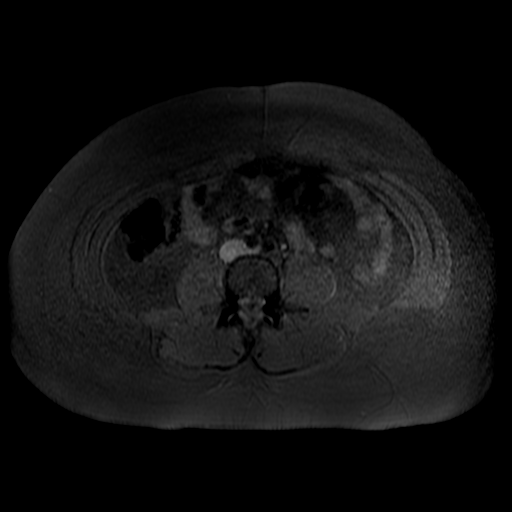
[im 52/104]
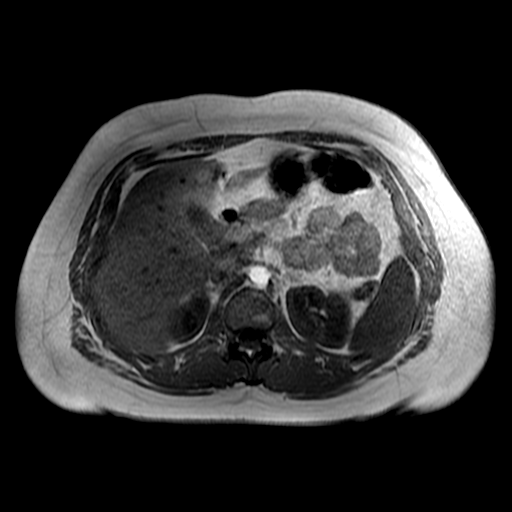
[im 104/104]
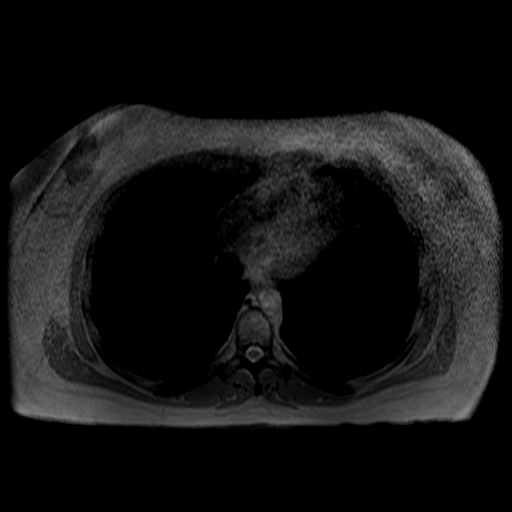

[Series 602: T1 dynamic · axial · 4.0mm · 0.82mm/px · z∈[-40,+166]mm · 3 of 104 slices shown (2 of 2)]
[im 1/104]
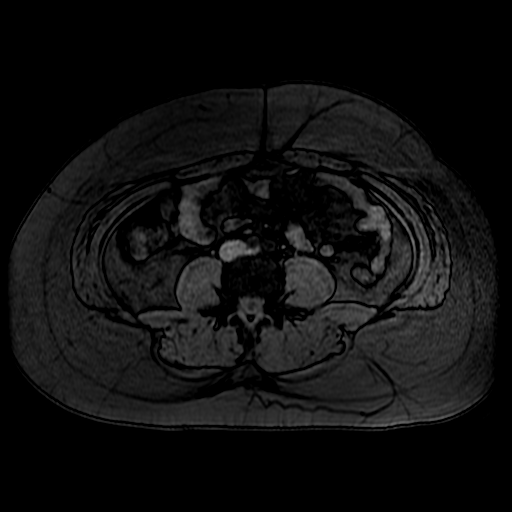
[im 52/104]
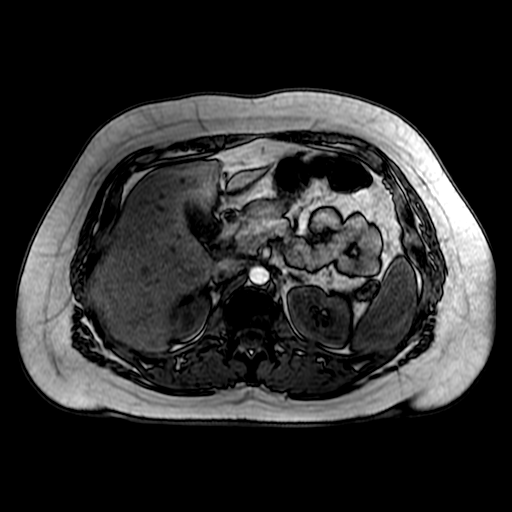
[im 104/104]
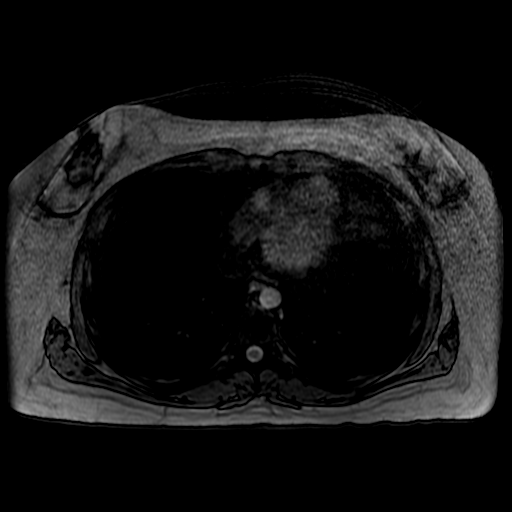

[Series 700: T1 dynamic post-contrast · axial · non-contrast · 4.0mm · 0.86mm/px · z∈[-40,+166]mm · 3 of 104 slices shown (2 of 3)]
[im 1/104]
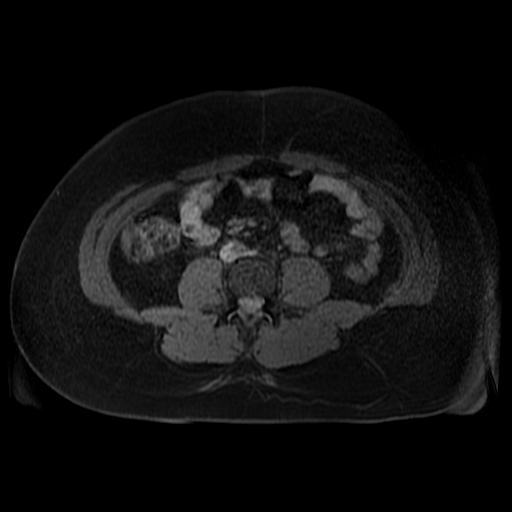
[im 52/104]
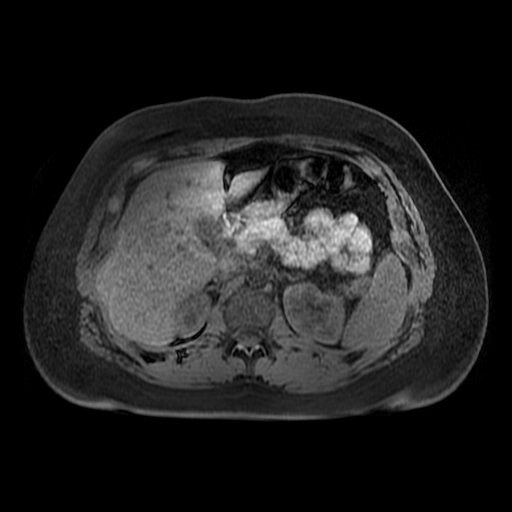
[im 104/104]
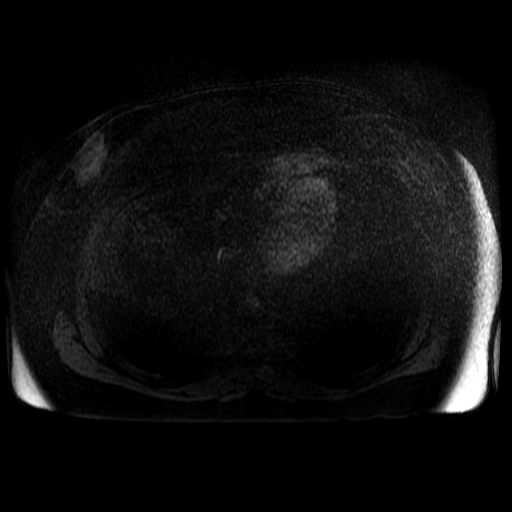

[Series 701: T1 dynamic post-contrast · axial · non-contrast · 4.0mm · 0.86mm/px · 1 of 104 slices shown (3 of 3)]
[im 1/104]
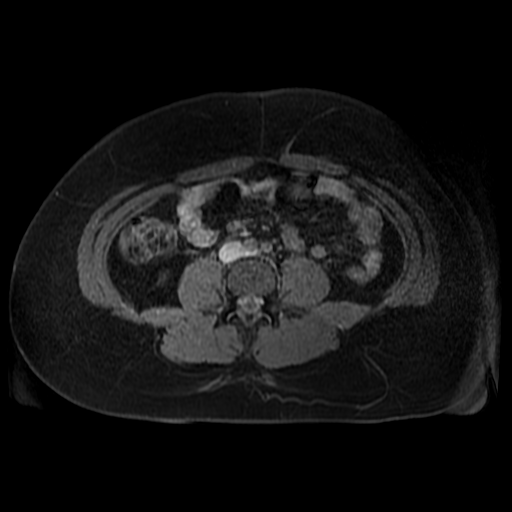

[20 of 48 positions shown; findings below may reference images not displayed]

FINDINGS: Lower chest:  Unremarkable.

Hepatobiliary: Throughout the liver there are heterogeneous areas
with loss of signal intensity on out of phase dual echo images,
compatible with areas of focal fatty deposition. This is most
pronounced in the anterior aspect of segment 3 of the liver, but is
noted in several other regions. On other pre and post gadolinium T1
and T2 weighted images there are no discrete lesions in the liver
identified to account for the perceived abnormality on the recent
ultrasound examination. No intra or extrahepatic biliary ductal
dilatation. Gallbladder is normal in appearance.

Pancreas: No pancreatic mass. No pancreatic ductal dilatation. No
pancreatic or peripancreatic fluid or inflammatory changes.

Spleen: Unremarkable.

Adrenals/Urinary Tract: Bilateral kidneys and bilateral adrenal
glands are normal in appearance. No hydroureteronephrosis in the
visualized abdomen.

Stomach/Bowel: Visualized portions are unremarkable.

Vascular/Lymphatic: No aneurysm identified in the visualized
abdominal vasculature. No lymphadenopathy noted in the abdomen.

Other: No significant volume of ascites in the visualized peritoneal
cavity.

Musculoskeletal: No aggressive osseous lesions are noted in the
visualized portions of the skeleton.
IMPRESSION: 1. No suspicious hepatic lesions are identified.
2. There is heterogeneous hepatic steatosis which likely accounts
for the perceived hyperechoic areas on the recent ultrasound
examination.

## 2017-08-10 DIAGNOSIS — R1031 Right lower quadrant pain: Secondary | ICD-10-CM | POA: Diagnosis not present

## 2017-08-10 DIAGNOSIS — N921 Excessive and frequent menstruation with irregular cycle: Secondary | ICD-10-CM | POA: Diagnosis not present

## 2017-08-25 DIAGNOSIS — M25511 Pain in right shoulder: Secondary | ICD-10-CM | POA: Diagnosis not present

## 2017-08-30 DIAGNOSIS — N912 Amenorrhea, unspecified: Secondary | ICD-10-CM | POA: Diagnosis not present

## 2017-09-01 DIAGNOSIS — N912 Amenorrhea, unspecified: Secondary | ICD-10-CM | POA: Diagnosis not present

## 2017-09-07 DIAGNOSIS — N911 Secondary amenorrhea: Secondary | ICD-10-CM | POA: Diagnosis not present

## 2017-09-08 DIAGNOSIS — R109 Unspecified abdominal pain: Secondary | ICD-10-CM | POA: Diagnosis not present

## 2017-09-16 DIAGNOSIS — N911 Secondary amenorrhea: Secondary | ICD-10-CM | POA: Diagnosis not present

## 2017-09-23 DIAGNOSIS — N911 Secondary amenorrhea: Secondary | ICD-10-CM | POA: Diagnosis not present

## 2017-10-06 DIAGNOSIS — Z3685 Encounter for antenatal screening for Streptococcus B: Secondary | ICD-10-CM | POA: Diagnosis not present

## 2017-10-06 DIAGNOSIS — Z3480 Encounter for supervision of other normal pregnancy, unspecified trimester: Secondary | ICD-10-CM | POA: Diagnosis not present

## 2017-10-06 DIAGNOSIS — Z3481 Encounter for supervision of other normal pregnancy, first trimester: Secondary | ICD-10-CM | POA: Diagnosis not present

## 2017-10-06 LAB — OB RESULTS CONSOLE ABO/RH: RH Type: NEGATIVE

## 2017-10-06 LAB — OB RESULTS CONSOLE RUBELLA ANTIBODY, IGM: RUBELLA: IMMUNE

## 2017-10-06 LAB — OB RESULTS CONSOLE HIV ANTIBODY (ROUTINE TESTING): HIV: NONREACTIVE

## 2017-10-06 LAB — OB RESULTS CONSOLE RPR: RPR: NONREACTIVE

## 2017-10-06 LAB — OB RESULTS CONSOLE GC/CHLAMYDIA
Chlamydia: NEGATIVE
GC PROBE AMP, GENITAL: NEGATIVE

## 2017-10-06 LAB — OB RESULTS CONSOLE HEPATITIS B SURFACE ANTIGEN: Hepatitis B Surface Ag: NEGATIVE

## 2017-10-06 LAB — OB RESULTS CONSOLE ANTIBODY SCREEN: Antibody Screen: NEGATIVE

## 2017-10-18 DIAGNOSIS — Z3480 Encounter for supervision of other normal pregnancy, unspecified trimester: Secondary | ICD-10-CM | POA: Diagnosis not present

## 2017-10-18 DIAGNOSIS — Z331 Pregnant state, incidental: Secondary | ICD-10-CM | POA: Diagnosis not present

## 2017-10-18 DIAGNOSIS — Z224 Carrier of infections with a predominantly sexual mode of transmission: Secondary | ICD-10-CM | POA: Diagnosis not present

## 2017-10-18 DIAGNOSIS — Z113 Encounter for screening for infections with a predominantly sexual mode of transmission: Secondary | ICD-10-CM | POA: Diagnosis not present

## 2017-10-18 DIAGNOSIS — Z124 Encounter for screening for malignant neoplasm of cervix: Secondary | ICD-10-CM | POA: Diagnosis not present

## 2017-10-18 DIAGNOSIS — Z3400 Encounter for supervision of normal first pregnancy, unspecified trimester: Secondary | ICD-10-CM | POA: Diagnosis not present

## 2017-10-27 DIAGNOSIS — Z3491 Encounter for supervision of normal pregnancy, unspecified, first trimester: Secondary | ICD-10-CM | POA: Diagnosis not present

## 2017-10-27 DIAGNOSIS — Z3682 Encounter for antenatal screening for nuchal translucency: Secondary | ICD-10-CM | POA: Diagnosis not present

## 2017-10-27 DIAGNOSIS — Z3A12 12 weeks gestation of pregnancy: Secondary | ICD-10-CM | POA: Diagnosis not present

## 2017-12-03 DIAGNOSIS — Z20828 Contact with and (suspected) exposure to other viral communicable diseases: Secondary | ICD-10-CM | POA: Diagnosis not present

## 2017-12-08 DIAGNOSIS — Z3480 Encounter for supervision of other normal pregnancy, unspecified trimester: Secondary | ICD-10-CM | POA: Diagnosis not present

## 2017-12-08 DIAGNOSIS — Z363 Encounter for antenatal screening for malformations: Secondary | ICD-10-CM | POA: Diagnosis not present

## 2017-12-08 DIAGNOSIS — Z3A18 18 weeks gestation of pregnancy: Secondary | ICD-10-CM | POA: Diagnosis not present

## 2018-01-06 DIAGNOSIS — Z23 Encounter for immunization: Secondary | ICD-10-CM | POA: Diagnosis not present

## 2018-01-24 ENCOUNTER — Telehealth: Payer: Self-pay | Admitting: Cardiology

## 2018-01-24 NOTE — Telephone Encounter (Signed)
Patient is [redacted] weeks pregnant.  She is currently having palpitations.  She had this with her last pregnancy as well.  She wants to know is there anything she should keep an eye on, that [facility]. I schedule her to see Dr. Tomie Chinaevankar on 1/6.

## 2018-01-24 NOTE — Telephone Encounter (Signed)
Pt appointment rescheduled for tomorrow. Advised [facility] to f[facility]u [facility] w/any progression of symptoms.

## 2018-01-25 ENCOUNTER — Ambulatory Visit (INDEPENDENT_AMBULATORY_CARE_PROVIDER_SITE_OTHER): Payer: BLUE CROSS/BLUE SHIELD | Admitting: Cardiology

## 2018-01-25 ENCOUNTER — Encounter: Payer: Self-pay | Admitting: Cardiology

## 2018-01-25 VITALS — BP 124/76 | HR 94 | Ht 67.0 in | Wt 266.0 lb

## 2018-01-25 DIAGNOSIS — K8020 Calculus of gallbladder without cholecystitis without obstruction: Secondary | ICD-10-CM | POA: Insufficient documentation

## 2018-01-25 DIAGNOSIS — R002 Palpitations: Secondary | ICD-10-CM | POA: Diagnosis not present

## 2018-01-25 DIAGNOSIS — Z331 Pregnant state, incidental: Secondary | ICD-10-CM | POA: Diagnosis not present

## 2018-01-25 DIAGNOSIS — K769 Liver disease, unspecified: Secondary | ICD-10-CM | POA: Insufficient documentation

## 2018-01-25 DIAGNOSIS — R34 Anuria and oliguria: Secondary | ICD-10-CM | POA: Insufficient documentation

## 2018-01-25 DIAGNOSIS — Z3490 Encounter for supervision of normal pregnancy, unspecified, unspecified trimester: Secondary | ICD-10-CM

## 2018-01-25 NOTE — Patient Instructions (Signed)
Medication Instructions:  Your physician recommends that you continue on your current medications as directed. Please refer to the Current Medication list given to you today.  If you need a refill on your cardiac medications before your next appointment, please call your pharmacy.   Lab work: Your physician recommends that you return for lab work today: TSH   If you have labs (blood work) drawn today and your tests are completely normal, you will receive your results only by: Marland Kitchen. MyChart Message (if you have MyChart) OR . A paper copy in the mail If you have any lab test that is abnormal or we need to change your treatment, we will call you to review the results.  Testing/Procedures:  Your physician has requested that you have an echocardiogram. Echocardiography is a painless test that uses sound waves to create images of your heart. It provides your doctor with information about the size and shape of your heart and how well your heart's chambers and valves are working. This procedure takes approximately one hour. There are no restrictions for this procedure.    Follow-Up: At [facility], you and your health needs are our priority.  As part of our continuing mission to provide you with exceptional heart care, we have created designated Provider Care Teams.  These Care Teams include your primary Cardiologist (physician) and Advanced Practice Providers (APPs -  Physician Assistants and Nurse Practitioners) who all work together to provide you with the ca re you need, when you need it. . You will need a follow up appointment in 6 months.  Please call our office 2 months in advance to schedule this appointment.

## 2018-01-25 NOTE — Addendum Note (Signed)
Addended by: Carren RangGIPSON, Sandralee Tarkington on: 01/25/2018 04:41 PM   Modules accepted: Orders

## 2018-01-25 NOTE — Progress Notes (Signed)
Cardiology Office Note:    Date:  01/25/2018   ID:  Cynthia Mackintoshrica N Frees, DOB Sep 14, 1985, MRN [id number]  PCP:  Renford DillsPolite, Ronald, MD  Cardiologist:  Garwin Brothersajan R Gaspard Isbell, MD   Referring MD: Renford DillsPolite, Ronald, MD    ASSESSMENT:    1. Palpitations   2. Pregnancy, unspecified gestational age    PLAN:    In order of problems listed above:  1. I reassured the patient about her findings.  Her palpitations have resolved so I have not given any active intervention.  I told her to keep herself well-hydrated and to lie down on her left side if she feels the palpitations.  Also I will give her beta-blockers as needed in the future if this continues to be a problem. 2. She is never had a TSH and I will do it today.  Echocardiogram will be done to assess murmur heard on auscultation.  She will be seen in follow-up appointment on a as needed basis only.   Medication Adjustments/Labs and Tests Ordered: Current medicines are reviewed at length with the patient today.  Concerns regarding medicines are outlined above.  No orders of the defined types were placed in this encounter.  No orders of the defined types were placed in this encounter.    No chief complaint on file.    History of Present Illness:    Cynthia English is a 32 y.o. female.  Patient is pregnant and has had palpitations in the past even with her previous pregnancy.  She mentions to me that she was feeling some palpitations and then she kept herself well-hydrated got some rest lying on her left side.  These resolved her palpitations.  She is here for that evaluation.  No chest pain orthopnea or PND.  She is very comfortable at this time.  She is never felt dizzy lightheaded or passed out.  At the time of my evaluation, the patient is alert awake oriented and in no distress.  Past Medical History:  Diagnosis Date  . Anemia   . GERD (gastroesophageal reflux disease)   . History of kidney stones   . Kidney stones   . Newborn product of in  vitro fertilization (IVF) pregnancy     Past Surgical History:  Procedure Laterality Date  . CESAREAN SECTION N/A 02/05/2017   Procedure: CESAREAN SECTION;  Surgeon: Mitchel HonourMorris, Megan, DO;  Location: [facility];  Service: Obstetrics;  Laterality: N/A;  . CHOLECYSTECTOMY N/A 05/12/2017   Procedure: LAPAROSCOPIC CHOLECYSTECTOMY;  Surgeon: Berna Bueonnor, Chelsea A, MD;  Location: [facility];  Service: General;  Laterality: N/A;  . DILATION AND CURETTAGE OF UTERUS    . FOOT SURGERY Bilateral   . PLANTAR FASCIA RELEASE    . toe rotation  2004    Current Medications: Current Meds  Medication Sig  . Prenatal Vit-Fe Fumarate-FA (PRENATAL MULTIVITAMIN) TABS tablet Take 1 tablet by mouth daily at 12 noon.     Allergies:   Prednisone   Social History   Socioeconomic History  . Marital status: Married    Spouse name: Not on file  . Number of children: Not on file  . Years of education: Not on file  . Highest education level: Not on file  Occupational History  . Not on file  Social Needs  . Financial resource strain: Not on file  . Food insecurity:    Worry: Not on file    Inability: Not on file  . Transportation needs:    Medical: Not on file  Non-medical: Not on file  Tobacco Use  . Smoking status: Never Smoker  . Smokeless tobacco: Never Used  Substance and Sexual Activity  . Alcohol use: No  . Drug use: No  . Sexual activity: Not on file  Lifestyle  . Physical activity:    Days per week: Not on file    Minutes per session: Not on file  . Stress: Not on file  Relationships  . Social connections:    Talks on phone: Not on file    Gets together: Not on file    Attends religious service: Not on file    Active member of club or organization: Not on file    Attends meetings of clubs or organizations: Not on file    Relationship status: Not on file  Other Topics Concern  . Not on file  Social History Narrative  . Not on file     Family History: The patient's family  history includes Arrhythmia in her father; Breast cancer in her paternal aunt and paternal grandmother; Colon cancer in her paternal grandfather; Diabetes in her paternal aunt; Hypertension in her father, maternal grandmother, and paternal grandmother; Supraventricular tachycardia in her father.  ROS:   Please see the history of present illness.    All other systems reviewed and are negative.  EKGs/Labs/Other Studies Reviewed:    The following studies were reviewed today: I discussed my findings with the patient at length.   Recent Labs: 05/12/2017: ALT 54; BUN 16; Creatinine, Ser 0.81; Hemoglobin 13.2; Platelets 360; Potassium 4.2; Sodium 139  Recent Lipid Panel No results found for: CHOL, TRIG, HDL, CHOLHDL, VLDL, LDLCALC, LDLDIRECT  Physical Exam:    VS:  BP 124/76 (BP Location: Right Arm, Patient Position: Sitting, Cuff Size: Normal)   Pulse 94   Ht 5\' 7"  (1.702 m)   Wt 266 lb (120.7 kg)   SpO2 99%   BMI 41.66 kg/m     Wt Readings from Last 3 Encounters:  01/25/18 266 lb (120.7 kg)  05/12/17 226 lb 11.2 oz (102.8 kg)  02/04/17 267 lb 12.8 oz (121.5 kg)     GEN: Patient is in no acute distress HEENT: Normal NECK: No JVD; No carotid bruits LYMPHATICS: No lymphadenopathy CARDIAC: Hear sounds regular, 2/6 systolic murmur at the apex. RESPIRATORY:  Clear to auscultation without rales, wheezing or rhonchi  ABDOMEN: Soft, non-tender, non-distended MUSCULOSKELETAL:  No edema; No deformity  SKIN: Warm and dry NEUROLOGIC:  Alert and oriented x 3 PSYCHIATRIC:  Normal affect   Signed, Garwin Brothersajan R Cadince Hilscher, MD  01/25/2018 4:24 PM    [facility]

## 2018-01-26 LAB — TSH: TSH: 1.72 u[IU]/mL (ref 0.450–4.500)

## 2018-01-30 DIAGNOSIS — J018 Other acute sinusitis: Secondary | ICD-10-CM | POA: Diagnosis not present

## 2018-01-31 ENCOUNTER — Ambulatory Visit: Payer: BLUE CROSS/BLUE SHIELD | Admitting: Cardiology

## 2018-02-01 ENCOUNTER — Telehealth: Payer: Self-pay

## 2018-02-01 DIAGNOSIS — Z3480 Encounter for supervision of other normal pregnancy, unspecified trimester: Secondary | ICD-10-CM | POA: Diagnosis not present

## 2018-02-01 DIAGNOSIS — Z3A26 26 weeks gestation of pregnancy: Secondary | ICD-10-CM | POA: Diagnosis not present

## 2018-02-01 DIAGNOSIS — O360920 Maternal care for other rhesus isoimmunization, second trimester, not applicable or unspecified: Secondary | ICD-10-CM | POA: Diagnosis not present

## 2018-02-01 NOTE — Telephone Encounter (Signed)
-----   Message from Garwin Brothers, MD sent at 02/01/2018  1:49 PM EST ----- The results of the study is unremarkable. Please inform patient. I will discuss in detail at next appointment. Cc  primary care/referring physician Garwin Brothers, MD 02/01/2018 1:48 PM

## 2018-02-01 NOTE — Telephone Encounter (Signed)
Patient called and notified of lab results. 

## 2018-02-08 ENCOUNTER — Inpatient Hospital Stay (HOSPITAL_COMMUNITY)
Admission: AD | Admit: 2018-02-08 | Discharge: 2018-02-08 | Disposition: A | Discharge status: Home / Self Care | Payer: BLUE CROSS/BLUE SHIELD | Source: Ambulatory Visit | Attending: Obstetrics and Gynecology | Admitting: Obstetrics and Gynecology

## 2018-02-08 ENCOUNTER — Encounter (HOSPITAL_COMMUNITY): Payer: Self-pay | Admitting: *Deleted

## 2018-02-08 ENCOUNTER — Other Ambulatory Visit: Payer: Self-pay

## 2018-02-08 DIAGNOSIS — R002 Palpitations: Secondary | ICD-10-CM | POA: Diagnosis not present

## 2018-02-08 DIAGNOSIS — Z3A27 27 weeks gestation of pregnancy: Secondary | ICD-10-CM | POA: Insufficient documentation

## 2018-02-08 DIAGNOSIS — O26892 Other specified pregnancy related conditions, second trimester: Secondary | ICD-10-CM | POA: Diagnosis not present

## 2018-02-08 DIAGNOSIS — R55 Syncope and collapse: Secondary | ICD-10-CM | POA: Diagnosis not present

## 2018-02-08 NOTE — MAU Note (Signed)
Has had heart palpitations with last preg, started again about 5 wks ago- has been to cardologist. (Got up to 120's for about an hour and a half yesterday) light headed last few days- today while driving got light headed, every thing went white for a second or 2.  Only happened the one time.  Has been light headed since. Getting over a sinus infection so ? HA. No changes in swelling or epigastric pain.

## 2018-02-08 NOTE — Discharge Instructions (Signed)
Near-Syncope Near-syncope is when you suddenly get weak or dizzy, or you feel like you might pass out (faint). This is due to a lack of blood flow to the brain. During an episode of near-syncope, you may:  Feel dizzy or light-headed.  Feel sick to your stomach (nauseous).  See all white or all black.  Have cold, clammy skin. This condition is caused by a sudden decrease in blood flow to the brain. This decrease can result from various causes, but most of those causes are not dangerous. However, near-syncope may be a sign of a serious medical problem, so it is important to seek medical care. Follow these instructions at home: Pay attention to any changes in your symptoms. Take these actions to help with your condition:  Have someone stay with you until you feel stable.  Talk with your doctor about your symptoms. You may need to have testing to understand the cause of your near-syncope.  Do not drive, use machinery, or play sports until your doctor says it is okay.  Keep all follow-up visits as told by your doctor. This is important.  If you start to feel like you might pass out, lie down right away and raise (elevate) your feet above the level of your heart. Breathe deeply and steadily. Wait until all of the symptoms are gone.  Drink enough fluid to keep your pee (urine) pale yellow. Medicines  If you are taking blood pressure or heart medicine, get up slowly and spend many minutes getting ready to sit and then stand. This can help with dizziness.  Take over-the-counter and prescription medicines only as told by your doctor. Get help right away if you:  Have a seizure.  Have pain in your: ? Chest. ? Tummy, ? Back.  Faint.  Have a bad headache.  Are bleeding from your mouth or butt.  Have black or tarry poop (stool).  Have a very fast or uneven heartbeat (palpitations).  Are confused.  Have trouble walking.  Are very weak.  Have trouble seeing. These symptoms may  represent a serious problem that is an emergency. Do not wait to see if your symptoms will go away. Get medical help right away. Call your local emergency services (911 in the [place] Do not drive yourself to the hospital. Summary  Near-syncope is when you suddenly get weak or dizzy, or you feel like you might pass out.  This condition is caused by a lack of blood flow to the brain.  Near-syncope may be a sign of a serious medical problem, so it is important to seek medical care. This information is not intended to replace advice given to you by your health care provider. Make sure you discuss any questions you have with your health care provider. Document Released: 07/01/2007 Document Revised: 09/07/2017 Document Reviewed: 09/26/2014 Elsevier Interactive Patient Education  2019 [employer].  Palpitations Palpitations are feelings that your heartbeat is not normal. Your heartbeat may feel like it is:  Uneven.  Faster than normal.  Fluttering.  Skipping a beat. This is usually not a serious problem. In some cases, you may need tests to rule out any serious problems. Follow these instructions at home: Pay attention to any changes in your condition. Take these actions to help manage your symptoms: Eating and drinking  Avoid: ? Coffee, tea, soft drinks, and energy drinks. ? Chocolate. ? Alcohol. ? Diet pills. Lifestyle   Try to lower your stress. These things can help you relax: ? Yoga. ? Deep  breathing and meditation. ? Exercise. ? Using words and images to create positive thoughts (guided imagery). ? Using your mind to control things in your body (biofeedback).  Do not use drugs.  Get plenty of rest and sleep. Keep a regular bed time. General instructions   Take over-the-counter and prescription medicines only as told by your doctor.  Do not use any products that contain nicotine or tobacco, such as cigarettes and e-cigarettes. If you need help quitting, ask your  doctor.  Keep all follow-up visits as told by your doctor. This is important. You may need more tests if palpitations do not go away or get worse. Contact a doctor if:  Your symptoms last more than 24 hours.  Your symptoms occur more often. Get help right away if you:  Have chest pain.  Feel short of breath.  Have a very bad headache.  Feel dizzy.  Pass out (faint). Summary  Palpitations are feelings that your heartbeat is uneven or faster than normal. It may feel like your heart is fluttering or skipping a beat.  Avoid food and drinks that may cause palpitations. These include caffeine, chocolate, and alcohol.  Try to lower your stress. Do not smoke or use drugs.  Get help right away if you faint or have chest pain, shortness of breath, a severe headache, or dizziness. This information is not intended to replace advice given to you by your health care provider. Make sure you discuss any questions you have with your health care provider. Document Released: 10/22/2007 Document Revised: 02/24/2017 Document Reviewed: 02/24/2017 Elsevier Interactive Patient Education  2019 [employer].

## 2018-02-08 NOTE — MAU Provider Note (Signed)
Obstetric Attending MAU Note  Chief Complaint:  light headed and Palpitations   First Provider Initiated Contact with Patient 02/08/18 1735     HPI: Cynthia English is a 33 y.o. G3P1011 at 5131w6d who presents to maternity admissions reporting frequent heart palpitaions. Wore Holter monitor last pregnancy. Due for ECHO in February. Increasing palpitations recently. Today had episode with HR in the 120's at minimum and feeling of light headedness, like going to pass out and noted a wave of white light across vision. Recent sinus infection, on amoxil. No vertigo. No dysuria. Denies contractions, leakage of fluid or vaginal bleeding. Good fetal movement.   Pregnancy Course: Receives care at [facility]  Patient Active Problem List   Diagnosis Date Noted  . Cholelithiasis without obstruction 01/25/2018  . Disease of liver 01/25/2018  . Oliguria 01/25/2018  . S/P cesarean section 02/05/2017  . Pregnancy 02/04/2017  . Palpitations 11/10/2016    Past Medical History:  Diagnosis Date  . Anemia   . GERD (gastroesophageal reflux disease)   . History of kidney stones   . Kidney stones   . Newborn product of in vitro fertilization (IVF) pregnancy     OB History  Gravida Para Term Preterm AB Living  3 1 1   1 1   SAB TAB Ectopic Multiple Live Births  1     0 1    # Outcome Date GA Lbr Len/2nd Weight Sex Delivery Anes PTL Lv  3 Current           2 Term 02/05/17 4466w2d 04:01 / 02:58 3855 g M CS-Vac EPI  LIV  1 SAB             Past Surgical History:  Procedure Laterality Date  . CESAREAN SECTION N/A 02/05/2017   Procedure: CESAREAN SECTION;  Surgeon: Mitchel HonourMorris, Megan, DO;  Location: [facility];  Service: Obstetrics;  Laterality: N/A;  . CHOLECYSTECTOMY N/A 05/12/2017   Procedure: LAPAROSCOPIC CHOLECYSTECTOMY;  Surgeon: Berna Bueonnor, Chelsea A, MD;  Location: [facility];  Service: General;  Laterality: N/A;  . DILATION AND CURETTAGE OF UTERUS    . FOOT SURGERY Bilateral   . PLANTAR  FASCIA RELEASE    . toe rotation  2004    Family History: Family History  Problem Relation Age of Onset  . Arrhythmia Father   . Supraventricular tachycardia Father   . Hypertension Father   . Diabetes Paternal Aunt   . Breast cancer Paternal Aunt   . Hypertension Maternal Grandmother   . Hypertension Paternal Grandmother   . Breast cancer Paternal Grandmother   . Colon cancer Paternal Grandfather     Social History: Social History   Tobacco Use  . Smoking status: Never Smoker  . Smokeless tobacco: Never Used  Substance Use Topics  . Alcohol use: No  . Drug use: No    Allergies:  Allergies  Allergen Reactions  . Prednisone Rash    Medications Prior to Admission  Medication Sig Dispense Refill Last Dose  . Prenatal Vit-Fe Fumarate-FA (PRENATAL MULTIVITAMIN) TABS tablet Take 1 tablet by mouth daily at 12 noon. 30 tablet 6 Taking    ROS: Pertinent findings in history of present illness.  Physical Exam  Blood pressure 128/77, pulse 87, temperature 98.1 F (36.7 C), temperature source Oral, resp. rate 17, weight 120.8 kg, last menstrual period 04/11/2017, SpO2 100 %, currently breastfeeding. CONSTITUTIONAL: Well-developed, well-nourished female in no acute distress.  HENT:  Normocephalic, atraumatic, External right and left ear normal. Oropharynx is  clear and moist EYES: Conjunctivae and EOM are normal. No scleral icterus.  NECK: Normal range of motion, supple, no masses SKIN: Skin is warm and dry. No rash noted. Not diaphoretic.  NEUROLGIC: Alert and oriented to person, place, and time.  PSYCHIATRIC: Normal mood and affect. Normal behavior. Normal judgment and thought content. CARDIOVASCULAR: Normal heart rate noted, regular rhythm RESPIRATORY: Effort and breath sounds normal ABDOMEN: Soft, nontender, nondistended, gravid appropriate for gestational age MUSCULOSKELETAL: Normal range of motion. No edema and no tenderness. 2+ distal pulses.  FHT:  Baseline 135 ,  moderate variability, accelerations present, no decelerations Contractions: none   MAU Course: Normal BP Normal pulse Stable fetal unit  Assessment: 1. Palpitations   2. Near syncope   3. [redacted] weeks gestation of pregnancy    Suspect PAC's/SVT leading to near syncope, exacerbated by pregnancy.  Plan: Discharge home Maintain hydration Avoid Caffeine. F/u with Cards as scheduled. Labor precautions and fetal kick counts reviewed Follow up with OB provider  Follow-up Information    [place], [facility] Follow up.   Why:  Keep next scheduled appointment Contact information: [address] [place] [address] [phone]           Allergies as of 02/08/2018      Reactions   Prednisone Rash      Medication List    TAKE these medications   prenatal multivitamin Tabs tablet Take 1 tablet by mouth daily at 12 noon.       Reva Bores, MD 02/08/2018 6:03 PM

## 2018-02-10 ENCOUNTER — Ambulatory Visit (INDEPENDENT_AMBULATORY_CARE_PROVIDER_SITE_OTHER): Payer: BLUE CROSS/BLUE SHIELD

## 2018-02-10 DIAGNOSIS — R002 Palpitations: Secondary | ICD-10-CM

## 2018-02-10 NOTE — Progress Notes (Signed)
Complete echocardiogram has been performed.  Jimmy Shimon Trowbridge RDCS, RVT 

## 2018-02-11 ENCOUNTER — Telehealth: Payer: Self-pay

## 2018-02-11 NOTE — Telephone Encounter (Signed)
Patient called and notified of test results. 

## 2018-02-11 NOTE — Telephone Encounter (Signed)
-----   Message from Garwin Brothers, MD sent at 02/11/2018  8:25 AM EST ----- The results of the study is unremarkable. Please inform patient. I will discuss in detail at next appointment. Cc  primary care/referring physician Garwin Brothers, MD 02/11/2018 8:25 AM

## 2018-03-08 ENCOUNTER — Other Ambulatory Visit: Payer: BLUE CROSS/BLUE SHIELD

## 2018-03-15 DIAGNOSIS — R809 Proteinuria, unspecified: Secondary | ICD-10-CM | POA: Diagnosis not present

## 2018-04-07 ENCOUNTER — Telehealth: Payer: Self-pay

## 2018-04-07 NOTE — Telephone Encounter (Signed)
Pt is [redacted] weeks pregnant and having more freq palpitations with HR 115-120. She states her palpitations will slow down when she sits but she does not get full relief until she lies down. She feels she needs to go on bedrest but OB (Dr. Langston Masker) will not clear her without cardiac evaluation . Patient wants to come in for immediate evaluation.Scheduled with Dr. Dulce Sellar 04/08/18 @4 :00. Dr.RRR cc on message.

## 2018-04-08 ENCOUNTER — Encounter: Payer: Self-pay | Admitting: Cardiology

## 2018-04-08 ENCOUNTER — Ambulatory Visit: Payer: BLUE CROSS/BLUE SHIELD | Admitting: Cardiology

## 2018-04-08 ENCOUNTER — Encounter (INDEPENDENT_AMBULATORY_CARE_PROVIDER_SITE_OTHER): Payer: BLUE CROSS/BLUE SHIELD

## 2018-04-08 ENCOUNTER — Other Ambulatory Visit: Payer: Self-pay

## 2018-04-08 VITALS — BP 108/64 | HR 97 | Ht 67.0 in | Wt 271.6 lb

## 2018-04-08 DIAGNOSIS — R002 Palpitations: Secondary | ICD-10-CM

## 2018-04-08 NOTE — Telephone Encounter (Signed)
I just saw this message since I was off yesterday afternoon.  I presume she will see Dr. Dulce Sellar today.

## 2018-04-08 NOTE — Patient Instructions (Signed)
Medication Instructions:  Your physician recommends that you continue on your current medications as directed. Please refer to the Current Medication list given to you today.  If you need a refill on your cardiac medications before your next appointment, please call your pharmacy.   Lab work: NONE If you have labs (blood work) drawn today and your tests are completely normal, you will receive your results only by: Marland Kitchen MyChart Message (if you have MyChart) OR . A paper copy in the mail If you have any lab test that is abnormal or we need to change your treatment, we will call you to review the results.  Testing/Procedures: Your physician has recommended that you wear a holter monitor. Holter monitors are medical devices that record the heart's electrical activity. Doctors most often use these monitors to diagnose arrhythmias. Arrhythmias are problems with the speed or rhythm of the heartbeat. The monitor is a small, portable device. You can wear one while you do your normal daily activities. This is usually used to diagnose what is causing palpitations/syncope (passing out).You will wear this device for 48 hours  Follow-Up: At [facility], you and your health needs are our priority.  As part of our continuing mission to provide you with exceptional heart care, we have created designated Provider Care Teams.  These Care Teams include your primary Cardiologist (physician) and Advanced Practice Providers (APPs -  Physician Assistants and Nurse Practitioners) who all work together to provide you with the care you need, when you need it. . You will need a follow up appointment as needed.  Any Other Special Instructions Will Be Listed Below   Ambulatory Cardiac Monitoring An ambulatory cardiac monitor is a small recording device that is used to detect abnormal heart rhythms (arrhythmias). Most monitors are connected by wires to flat, sticky disks (electrodes) that are then attached to your chest.  You may need to wear a monitor if you have had symptoms such as:  Fast heartbeats (palpitations).  Dizziness.  Fainting or light-headedness.  Unexplained weakness.  Shortness of breath. There are several types of monitors. Some common monitors include:  Holter monitor. This records your heart rhythm continuously, usually for 24-48 hours.  Event (episodic) monitor. This monitor has a symptoms button, and when pushed, it will begin recording. You need to activate this monitor to record when you have a heart-related symptom.  Automatic detection monitor. This monitor will begin recording when it detects an abnormal heartbeat. What are the risks? Generally, these devices are safe to use. However, it is possible that the skin under the electrodes will become irritated. How to prepare for monitoring Your health care provider will prepare your chest for the electrode placement and show you how to use the monitor.  Do not apply lotions to your chest before monitoring.  Follow directions on how to care for the monitor, and how to return the monitor when the testing period is complete. How to use your cardiac monitor  Follow directions about how long to wear the monitor, and if you can take the monitor off in order to shower or bathe. ? Do not let the monitor get wet. ? Do not bathe, swim, or use a hot tub while wearing the monitor.  Keep your skin clean. Do not put body lotion or moisturizer on your chest.  Change the electrodes as told by your health care provider, or any time they stop sticking to your skin. You may need to use medical tape to keep them on.  Try to put the electrodes in slightly different places on your chest to help prevent skin irritation. Follow directions from your health care provider about where to place the electrodes.  Make sure the monitor is safely clipped to your clothing or in a location close to your body as recommended by your health care provider.  If  your monitor has a symptoms button, press the button to mark an event as soon as you feel a heart-related symptom, such as: ? Dizziness. ? Weakness. ? Light-headedness. ? Palpitations. ? Thumping or pounding in your chest. ? Shortness of breath. ? Unexplained weakness.  Keep a diary of your activities, such as walking, doing chores, and taking medicine. It is very important to note what you were doing when you pushed the button to record your symptoms. This will help your health care provider determine what might be contributing to your symptoms.  Send the recorded information as recommended by your health care provider. It may take some time for your health care provider to process the results.  Change the batteries as told by your health care provider.  Keep electronic devices away from your monitor. These include: ? Tablets. ? MP3 players. ? Cell phones.  While wearing your monitor you should avoid: ? Electric blankets. ? Firefighter. ? Electric toothbrushes. ? Microwave ovens. ? Magnets. ? Metal detectors. Get help right away if:  You have chest pain.  You have shortness of breath or extreme difficulty breathing.  You develop a very fast heartbeat that does not get better.  You develop dizziness that does not go away.  You faint or constantly feel like you are about to faint. Summary  An ambulatory cardiac monitor is a small recording device that is used to detect abnormal heart rhythms (arrhythmias).  Make sure you understand how to send the information from the monitor to your health care provider.  It is important to press the button on the monitor when you have any heart-related symptoms.  Keep a diary of your activities, such as walking, doing chores, and taking medicine. It is very important to note what you were doing when you pushed the button to record your symptoms. This will help your health care provider learn what might be causing your symptoms.  This information is not intended to replace advice given to you by your health care provider. Make sure you discuss any questions you have with your health care provider. Document Released: 10/22/2007 Document Revised: 10/28/2016 Document Reviewed: 12/28/2015 Elsevier Interactive Patient Education  2019 [employer].

## 2018-04-08 NOTE — Progress Notes (Signed)
Cardiology Office Note:    Date:  04/08/2018   ID:  Cynthia English, DOB 17-Jul-1985, MRN [id number]  PCP:  Patient, No Pcp Per  Cardiologist:  Garwin Brothers, MD   Referring MD: No ref. provider found    ASSESSMENT:    1. Palpitations    PLAN:    In order of problems listed above:  1. Prevention stressed with the patient.  I reassured her about my findings.  I told her to increase her salt and fluid in the diet.  Her TSH recently was unremarkable.  I will do a 48-hour Holter monitor to evaluate her symptoms.  She will be seen in follow-up appointment on a as needed basis.  I told her to be in touch with her obstetrician about her symptoms to see if she needs to be possibly off work based on her symptoms related to pregnancy.   Medication Adjustments/Labs and Tests Ordered: Current medicines are reviewed at length with the patient today.  Concerns regarding medicines are outlined above.  No orders of the defined types were placed in this encounter.  No orders of the defined types were placed in this encounter.    No chief complaint on file.    History of Present Illness:    Cynthia English is a 33 y.o. female.  She is coming close to the end of pregnancy.  She is here for evaluation for palpitations.  She mentions to me that she had the same symptoms last time at the end of her first pregnancy and her obstetrician kept her off work.  She feels that her heart is beating fast.  She has been told to lie down on her left side and she feels better when she does that but she says that work does not permit her to do that more than twice.  No chest pain orthopnea PND or syncope.  At the time of my evaluation, the patient is alert awake oriented and in no distress.  Past Medical History:  Diagnosis Date  . Anemia   . GERD (gastroesophageal reflux disease)   . History of kidney stones   . Kidney stones   . Newborn product of in vitro fertilization (IVF) pregnancy     Past Surgical  History:  Procedure Laterality Date  . CESAREAN SECTION N/A 02/05/2017   Procedure: CESAREAN SECTION;  Surgeon: Mitchel Honour, DO;  Location: [facility];  Service: Obstetrics;  Laterality: N/A;  . CHOLECYSTECTOMY N/A 05/12/2017   Procedure: LAPAROSCOPIC CHOLECYSTECTOMY;  Surgeon: Berna Bue, MD;  Location: [facility];  Service: General;  Laterality: N/A;  . DILATION AND CURETTAGE OF UTERUS    . FOOT SURGERY Bilateral   . PLANTAR FASCIA RELEASE    . toe rotation  2004    Current Medications: Current Meds  Medication Sig  . Prenatal Vit-Fe Fumarate-FA (PRENATAL MULTIVITAMIN) TABS tablet Take 1 tablet by mouth daily at 12 noon.     Allergies:   Prednisone   Social History   Socioeconomic History  . Marital status: Married    Spouse name: Not on file  . Number of children: Not on file  . Years of education: Not on file  . Highest education level: Not on file  Occupational History  . Not on file  Social Needs  . Financial resource strain: Not on file  . Food insecurity:    Worry: Not on file    Inability: Not on file  . Transportation needs:    Medical:  Not on file    Non-medical: Not on file  Tobacco Use  . Smoking status: Never Smoker  . Smokeless tobacco: Never Used  Substance and Sexual Activity  . Alcohol use: No  . Drug use: No  . Sexual activity: Not on file  Lifestyle  . Physical activity:    Days per week: Not on file    Minutes per session: Not on file  . Stress: Not on file  Relationships  . Social connections:    Talks on phone: Not on file    Gets together: Not on file    Attends religious service: Not on file    Active member of club or organization: Not on file    Attends meetings of clubs or organizations: Not on file    Relationship status: Not on file  Other Topics Concern  . Not on file  Social History Narrative  . Not on file     Family History: The patient's family history includes Arrhythmia in her father; Breast cancer in her  paternal aunt and paternal grandmother; Colon cancer in her paternal grandfather; Diabetes in her paternal aunt; Hypertension in her father, maternal grandmother, and paternal grandmother; Supraventricular tachycardia in her father.  ROS:   Please see the history of present illness.    All other systems reviewed and are negative.  EKGs/Labs/Other Studies Reviewed:    The following studies were reviewed today: EKG reveals sinus rhythm and nonspecific ST-T changes.   Recent Labs: 05/12/2017: ALT 54; BUN 16; Creatinine, Ser 0.81; Hemoglobin 13.2; Platelets 360; Potassium 4.2; Sodium 139 01/25/2018: TSH 1.720  Recent Lipid Panel No results found for: CHOL, TRIG, HDL, CHOLHDL, VLDL, LDLCALC, LDLDIRECT  Physical Exam:    VS:  BP 108/64 (BP Location: Right Arm, Patient Position: Sitting, Cuff Size: Normal)   Pulse 97   Ht 5\' 7"  (1.702 m)   Wt 271 lb 9.6 oz (123.2 kg)   LMP 04/11/2017   SpO2 99%   BMI 42.54 kg/m     Wt Readings from Last 3 Encounters:  04/08/18 271 lb 9.6 oz (123.2 kg)  02/08/18 266 lb 4 oz (120.8 kg)  01/25/18 266 lb (120.7 kg)     GEN: Patient is in no acute distress HEENT: Normal NECK: No JVD; No carotid bruits LYMPHATICS: No lymphadenopathy CARDIAC: Hear sounds regular, 2/6 systolic murmur at the apex. RESPIRATORY:  Clear to auscultation without rales, wheezing or rhonchi  ABDOMEN: Soft, non-tender, non-distended MUSCULOSKELETAL:  No edema; No deformity  SKIN: Warm and dry NEUROLOGIC:  Alert and oriented x 3 PSYCHIATRIC:  Normal affect   Signed, Garwin Brothers, MD  04/08/2018 4:16 PM    [facility]

## 2018-04-12 DIAGNOSIS — Z3685 Encounter for antenatal screening for Streptococcus B: Secondary | ICD-10-CM | POA: Diagnosis not present

## 2018-04-17 ENCOUNTER — Inpatient Hospital Stay (HOSPITAL_COMMUNITY)
Admission: AD | Admit: 2018-04-17 | Discharge: 2018-04-17 | Disposition: A | Discharge status: Home / Self Care | Payer: BLUE CROSS/BLUE SHIELD | Attending: Obstetrics and Gynecology | Admitting: Obstetrics and Gynecology

## 2018-04-17 ENCOUNTER — Other Ambulatory Visit: Payer: Self-pay

## 2018-04-17 ENCOUNTER — Encounter (HOSPITAL_COMMUNITY): Payer: Self-pay

## 2018-04-17 DIAGNOSIS — K219 Gastro-esophageal reflux disease without esophagitis: Secondary | ICD-10-CM | POA: Insufficient documentation

## 2018-04-17 DIAGNOSIS — O26853 Spotting complicating pregnancy, third trimester: Secondary | ICD-10-CM | POA: Diagnosis not present

## 2018-04-17 DIAGNOSIS — O26893 Other specified pregnancy related conditions, third trimester: Secondary | ICD-10-CM | POA: Diagnosis not present

## 2018-04-17 DIAGNOSIS — Z3A37 37 weeks gestation of pregnancy: Secondary | ICD-10-CM | POA: Insufficient documentation

## 2018-04-17 DIAGNOSIS — Z87442 Personal history of urinary calculi: Secondary | ICD-10-CM | POA: Diagnosis not present

## 2018-04-17 DIAGNOSIS — O34219 Maternal care for unspecified type scar from previous cesarean delivery: Secondary | ICD-10-CM

## 2018-04-17 HISTORY — DX: Palpitations: R00.2

## 2018-04-17 LAB — URINALYSIS, ROUTINE W REFLEX MICROSCOPIC
BACTERIA UA: NONE SEEN
Bilirubin Urine: NEGATIVE
Glucose, UA: NEGATIVE mg/dL
KETONES UR: NEGATIVE mg/dL
Leukocytes,Ua: NEGATIVE
Nitrite: NEGATIVE
PH: 5 (ref 5.0–8.0)
PROTEIN: 30 mg/dL — AB
Specific Gravity, Urine: 1.026 (ref 1.005–1.030)

## 2018-04-17 NOTE — MAU Note (Signed)
Pt has been losing her mucous plug. Was 2cm in office last week. Called office and was told to come in. Scheduled c/s 4/8. Nausea lately, no vomiting or diarrhea. No contractions +FM

## 2018-04-17 NOTE — Discharge Instructions (Signed)
Vaginal Bleeding During Pregnancy, Third Trimester  Braxton Hicks Contractions Contractions of the uterus can occur throughout pregnancy, but they are not always a sign that you are in labor. You may have practice contractions called Braxton Hicks contractions. These false labor contractions are sometimes confused with true labor. What are Deberah Pelton contractions? Braxton Hicks contractions are tightening movements that occur in the muscles of the uterus before labor. Unlike true labor contractions, these contractions do not result in opening (dilation) and thinning of the cervix. Toward the end of pregnancy (32-34 weeks), Braxton Hicks contractions can happen more often and may become stronger. These contractions are sometimes difficult to tell apart from true labor because they can be very uncomfortable. You should not feel embarrassed if you go to the hospital with false labor. Sometimes, the only way to tell if you are in true labor is for your health care provider to look for changes in the cervix. The health care provider will do a physical exam and may monitor your contractions. If you are not in true labor, the exam should show that your cervix is not dilating and your water has not broken. If there are no other health problems associated with your pregnancy, it is completely safe for you to be sent home with false labor. You may continue to have Braxton Hicks contractions until you go into true labor. How to tell the difference between true labor and false labor True labor  Contractions last 30-70 seconds.  Contractions become very regular.  Discomfort is usually felt in the top of the uterus, and it spreads to the lower abdomen and low back.  Contractions do not go away with walking.  Contractions usually become more intense and increase in frequency.  The cervix dilates and gets thinner. False labor  Contractions are usually shorter and not as strong as true labor  contractions.  Contractions are usually irregular.  Contractions are often felt in the front of the lower abdomen and in the groin.  Contractions may go away when you walk around or change positions while lying down.  Contractions get weaker and are shorter-lasting as time goes on.  The cervix usually does not dilate or become thin. Follow these instructions at home:   Take over-the-counter and prescription medicines only as told by your health care provider.  Keep up with your usual exercises and follow other instructions from your health care provider.  Eat and drink lightly if you think you are going into labor.  If Braxton Hicks contractions are making you uncomfortable: ? Change your position from lying down or resting to walking, or change from walking to resting. ? Sit and rest in a tub of warm water. ? Drink enough fluid to keep your urine pale yellow. Dehydration may cause these contractions. ? Do slow and deep breathing several times an hour.  Keep all follow-up prenatal visits as told by your health care provider. This is important. Contact a health care provider if:  You have a fever.  You have continuous pain in your abdomen. Get help right away if:  Your contractions become stronger, more regular, and closer together.  You have fluid leaking or gushing from your vagina.  You pass blood-tinged mucus (bloody show).  You have bleeding from your vagina.  You have low back pain that you never had before.  You feel your babys head pushing down and causing pelvic pressure.  Your baby is not moving inside you as much as it used to. Summary  Contractions that occur before labor are called Braxton Hicks contractions, false labor, or practice contractions.  Braxton Hicks contractions are usually shorter, weaker, farther apart, and less regular than true labor contractions. True labor contractions usually become progressively stronger and regular, and they become  more frequent.  Manage discomfort from [facility] contractions by changing position, resting in a warm bath, drinking plenty of water, or practicing deep breathing. This information is not intended to replace advice given to you by your health care provider. Make sure you discuss any questions you have with your health care provider. Document Released: 05/28/2016 Document Revised: 10/27/2016 Document Reviewed: 05/28/2016 Elsevier Interactive Patient Education  2019 Elsevier Inc.  A small amount of bleeding from the vagina (spotting) is relatively common during pregnancy. Various things can cause bleeding or spotting during pregnancy. Sometimes bleeding is normal and is not a problem. However, bleeding during the third trimester can also be a sign of something serious for the mother and the baby. Be sure to tell your health care provider about any vaginal bleeding right away. Some possible causes of vaginal bleeding during the third trimester include:  Infection or growths (polyps) on the cervix.  A condition in which the placenta partially or completely covers the opening of the cervix inside the uterus (placenta previa).  The placenta separating from the uterus (placenta abruption).  The start of labor (discharging of the mucus plug).  A condition in which the placenta grows into the muscle layer of the uterus (placenta accreta). Follow these instructions at home: Activity  Follow instructions from your health care provider about limiting your activity. If your health care provider recommends activity restriction, you may need to stay in bed and only get up to use the bathroom. In some cases, your health care provider may allow you to continue light activity.  If needed, make plans for someone to help with your regular activities.  Ask your health care provider if it is safe for you to drive.  Do not lift anything that is heavier than 10 lb (4.5 kg), or the limit that your health  care provider tells you, until he or she says that it is safe.  Do not have sex or orgasms until your health care provider says that this is safe. Medicines  Take over-the-counter and prescription medicines only as told by your health care provider.  Do not take aspirin because it can cause bleeding. General instructions  Pay attention to any changes in your symptoms.  Write down how many pads you use each day, how often you change pads, and how soaked (saturated) they are.  Do not use tampons or douche.  If you pass any tissue from your vagina, save the tissue so you can show it to your health care provider.  Keep all follow-up visits as told by your health care provider. This is important. Contact a health care provider if:  You have vaginal bleeding during any part of your pregnancy.  You have cramps or labor pains.  You have a fever. Get help right away if:  You have severe cramps or pain in your back or abdomen.  You have a gush of fluid from the vagina.  You pass large clots or a large amount of tissue from your vagina.  Your bleeding increases.  You feel light-headed or weak.  You faint.  You feel that your baby is moving less than usual, or not moving at all. Summary  Various things can cause bleeding or spotting  in pregnancy.  Bleeding during the third trimester can be a sign of a serious problem for the mother and the baby.  Be sure to tell your health care provider about any vaginal bleeding right away. This information is not intended to replace advice given to you by your health care provider. Make sure you discuss any questions you have with your health care provider. Document Released: 04/04/2002 Document Revised: 04/16/2016 Document Reviewed: 04/16/2016 Elsevier Interactive Patient Education  2019 [employer]

## 2018-04-17 NOTE — MAU Provider Note (Signed)
CC:  Chief Complaint  Patient presents with  . Mucous plug     First Provider Initiated Contact with Patient 04/17/18 1335      HPI: Cynthia English is a 33 y.o. year old G49P1011 female at [redacted]w[redacted]d weeks gestation who presents to MAU reporting passing bloody mucus yesterday and today. Called Ob and was told to MAU for assessment. Hx C/S 14 months ago.   Associated Sx:  Contractions: Rare, mild (~ 1 per hour) Vaginal bleeding: Bloody mucus. No frank blood or clots. Leaking of fluid: Denies Fetal movement: Nml  Past Medical History:  Diagnosis Date  . Anemia   . GERD (gastroesophageal reflux disease)   . Heart palpitations   . History of kidney stones   . Kidney stones   . Newborn product of in vitro fertilization (IVF) pregnancy    Past Surgical History:  Procedure Laterality Date  . CESAREAN SECTION N/A 02/05/2017   Procedure: CESAREAN SECTION;  Surgeon: Mitchel Honour, DO;  Location: [facility];  Service: Obstetrics;  Laterality: N/A;  . CHOLECYSTECTOMY N/A 05/12/2017   Procedure: LAPAROSCOPIC CHOLECYSTECTOMY;  Surgeon: Berna Bue, MD;  Location: [facility];  Service: General;  Laterality: N/A;  . DILATION AND CURETTAGE OF UTERUS    . FOOT SURGERY Bilateral   . PLANTAR FASCIA RELEASE    . toe rotation  2004   Social History   Socioeconomic History  . Marital status: Married    Spouse name: Not on file  . Number of children: Not on file  . Years of education: Not on file  . Highest education level: Not on file  Occupational History  . Not on file  Social Needs  . Financial resource strain: Not on file  . Food insecurity:    Worry: Not on file    Inability: Not on file  . Transportation needs:    Medical: Not on file    Non-medical: Not on file  Tobacco Use  . Smoking status: Never Smoker  . Smokeless tobacco: Never Used  Substance and Sexual Activity  . Alcohol use: No  . Drug use: No  . Sexual activity: Not on file  Lifestyle  . Physical activity:    Days per week: Not on file    Minutes per session: Not on file  . Stress: Not on file  Relationships  . Social connections:    Talks on phone: Not on file    Gets together: Not on file    Attends religious service: Not on file    Active member of club or organization: Not on file    Attends meetings of clubs or organizations: Not on file    Relationship status: Not on file  . Intimate partner violence:    Fear of current or ex partner: Not on file    Emotionally abused: Not on file    Physically abused: Not on file    Forced sexual activity: Not on file  Other Topics Concern  . Not on file  Social History Narrative  . Not on file   Family History  Problem Relation Age of Onset  . Arrhythmia Father   . Supraventricular tachycardia Father   . Hypertension Father   . Diabetes Paternal Aunt   . Breast cancer Paternal Aunt   . Hypertension Maternal Grandmother   . Hypertension Paternal Grandmother   . Breast cancer Paternal Grandmother   . Colon cancer Paternal Grandfather     O:  Patient Vitals for the past  24 hrs:  BP Temp Pulse Resp SpO2 Weight  04/17/18 1308 123/77 - (!) 107 - - -  04/17/18 1248 125/90 98.4 F (36.9 C) - 16 100 % 122.2 kg    General: NAD Heart: Regular rate Lungs: Normal rate and effort Abd: Soft, NT, Gravid, S=D Pelvic: NEFG, Neg pooling, Scant dark red bloody mucus. No active bleeding. Cervix non-friable Showed CNM pictures of bloody mucus that she passed at home. Do not appear to be blood clots.  Dilation: 3.5 Effacement (%): 50 Station: -3 Presentation: Vertex Exam by:: a gagliardo rn  EFM: 140, Moderate variability, 15 x 15 accelerations, no decelerations Toco: Contractions 1, mild  Orders Placed This Encounter  Procedures  . Urinalysis, Routine w reflex microscopic  . Contraction - monitoring  . External fetal heart monitoring  . Vaginal exam  . Discharge patient    A: [redacted]w[redacted]d week IUP Passage of bloody show and mucus. Low  suspicion that bleeding is due to uterine rupture, abruption or active labor.  FHR reactive  P: Discharge home in stable condition. Labor and bleeding precautions and fetal kick counts. Follow-up Information    [facility] Follow up on 04/19/2018.   Why:  as scheduled or sooner as needed if symptoms worsen Contact information: [address] [place] [address] [phone]        [facility] Follow up.   Specialty:  Obstetrics and Gynecology Why:  as needed if symptoms worsen or for labor, broken water or decrease fetal movement.  Contact information: [address] [id number] mc [address] [place] [address] [phone]           Katrinka Blazing [place], [place] 04/17/2018 2:43 PM  3

## 2018-04-18 ENCOUNTER — Telehealth: Payer: Self-pay

## 2018-04-18 NOTE — Telephone Encounter (Signed)
Patient called and notified of test results. 

## 2018-04-18 NOTE — Telephone Encounter (Signed)
-----   Message from Rajan R Revankar, MD sent at 04/18/2018 10:33 AM EDT ----- The results of the study is unremarkable. Please inform patient. I will discuss in detail at next appointment. Cc  primary care/referring physician Rajan R Revankar, MD 04/18/2018 10:33 AM 

## 2018-04-26 ENCOUNTER — Telehealth (HOSPITAL_COMMUNITY): Payer: Self-pay | Admitting: *Deleted

## 2018-04-26 ENCOUNTER — Encounter (HOSPITAL_COMMUNITY): Payer: Self-pay

## 2018-04-26 NOTE — Telephone Encounter (Signed)
Preadmission screen  

## 2018-04-27 ENCOUNTER — Encounter (HOSPITAL_COMMUNITY): Payer: Self-pay

## 2018-04-27 NOTE — Patient Instructions (Signed)
Cynthia English  04/27/2018   Your procedure is scheduled on:  05/04/2018  Arrive at 0530 at Entrance C on [address] at [facility] and [employer]. You are invited to use the FREE valet parking or use the Visitor's parking deck.  Pick up the phone at the desk and dial [phone].  Call this number if you have problems the morning of surgery: [phone]  Remember:   Do not eat food:(After Midnight) Desps de medianoche.  Do not drink clear liquids: (After Midnight) Desps de medianoche.  Take these medicines the morning of surgery with A SIP OF WATER:  none   Do not wear jewelry, make-up or nail polish.  Do not wear lotions, powders, or perfumes. Do not wear deodorant.  Do not shave 48 hours prior to surgery.  Do not bring valuables to the hospital.  [facility] is not   responsible for any belongings or valuables brought to the hospital.  Contacts, dentures or bridgework may not be worn into surgery.  Leave suitcase in the car. After surgery it may be brought to your room.  For patients admitted to the hospital, checkout time is 11:00 AM the day of              discharge.      Please read over the following fact sheets that you were given:     Preparing for Surgery

## 2018-04-28 ENCOUNTER — Encounter (HOSPITAL_COMMUNITY): Payer: Self-pay | Admitting: *Deleted

## 2018-04-28 ENCOUNTER — Encounter (HOSPITAL_COMMUNITY): Payer: Self-pay

## 2018-04-28 ENCOUNTER — Inpatient Hospital Stay (EMERGENCY_DEPARTMENT_HOSPITAL)
Admission: AD | Admit: 2018-04-28 | Discharge: 2018-04-29 | Disposition: A | Discharge status: Home / Self Care | Payer: BLUE CROSS/BLUE SHIELD | Source: Home / Self Care | Attending: Obstetrics and Gynecology | Admitting: Obstetrics and Gynecology

## 2018-04-28 ENCOUNTER — Other Ambulatory Visit: Payer: Self-pay

## 2018-04-28 ENCOUNTER — Inpatient Hospital Stay (EMERGENCY_DEPARTMENT_HOSPITAL)
Admission: AD | Admit: 2018-04-28 | Discharge: 2018-04-28 | Disposition: A | Discharge status: Home / Self Care | Payer: BLUE CROSS/BLUE SHIELD | Source: Home / Self Care | Attending: Obstetrics and Gynecology | Admitting: Obstetrics and Gynecology

## 2018-04-28 DIAGNOSIS — Z3A38 38 weeks gestation of pregnancy: Secondary | ICD-10-CM | POA: Diagnosis not present

## 2018-04-28 DIAGNOSIS — Z3A00 Weeks of gestation of pregnancy not specified: Secondary | ICD-10-CM | POA: Diagnosis not present

## 2018-04-28 DIAGNOSIS — Z23 Encounter for immunization: Secondary | ICD-10-CM | POA: Diagnosis not present

## 2018-04-28 DIAGNOSIS — Q825 Congenital non-neoplastic nevus: Secondary | ICD-10-CM | POA: Diagnosis not present

## 2018-04-28 DIAGNOSIS — O99214 Obesity complicating childbirth: Secondary | ICD-10-CM | POA: Diagnosis not present

## 2018-04-28 DIAGNOSIS — O471 False labor at or after 37 completed weeks of gestation: Secondary | ICD-10-CM | POA: Diagnosis not present

## 2018-04-28 DIAGNOSIS — O34211 Maternal care for low transverse scar from previous cesarean delivery: Secondary | ICD-10-CM | POA: Diagnosis not present

## 2018-04-28 DIAGNOSIS — O34219 Maternal care for unspecified type scar from previous cesarean delivery: Secondary | ICD-10-CM | POA: Diagnosis not present

## 2018-04-28 DIAGNOSIS — O479 False labor, unspecified: Secondary | ICD-10-CM

## 2018-04-28 MED ORDER — OXYCODONE-ACETAMINOPHEN 5-325 MG PO TABS
1.0000 | ORAL_TABLET | Freq: Once | ORAL | Status: AC
  Administered 2018-04-28: 1 via ORAL
  Filled 2018-04-28: qty 1

## 2018-04-28 NOTE — Discharge Instructions (Signed)
Cesarean Delivery °Cesarean birth, or cesarean delivery, is the surgical delivery of a baby through an incision in the abdomen and the uterus. This may be referred to as a C-section. This procedure may be scheduled ahead of time, or it may be done in an emergency situation. °Tell a health care provider about: °· Any allergies you have. °· All medicines you are taking, including vitamins, herbs, eye drops, creams, and over-the-counter medicines. °· Any problems you or family members have had with anesthetic medicines. °· Any blood disorders you have. °· Any surgeries you have had. °· Any medical conditions you have. °· Whether you or any members of your family have a history of deep vein thrombosis (DVT) or pulmonary embolism (PE). °What are the risks? °Generally, this is a safe procedure. However, problems may occur, including: °· Infection. °· Bleeding. °· Allergic reactions to medicines. °· Damage to other structures or organs. °· Blood clots. °· Injury to your baby. °What happens before the procedure? °General instructions °· Follow instructions from your health care provider about eating or drinking restrictions. °· If you know that you are going to have a cesarean delivery, do not shave your pubic area. Shaving before the procedure may increase your risk of infection. °· Plan to have someone take you home from the hospital. °· Ask your health care provider what steps will be taken to prevent infection. These may include: °? Removing hair at the surgery site. °? Washing skin with a germ-killing soap. °? Taking antibiotic medicine. °· Depending on the reason for your cesarean delivery, you may have a physical exam or additional testing, such as an ultrasound. °· You may have your blood or urine tested. °Questions for your health care provider °· Ask your health care provider about: °? Changing or stopping your regular medicines. This is especially important if you are taking diabetes medicines or blood  thinners. °? Your pain management plan. This is especially important if you plan to breastfeed your baby. °? How long you will be in the hospital after the procedure. °? Any concerns you may have about receiving blood products, if you need them during the procedure. °? Cord blood banking, if you plan to collect your baby's umbilical cord blood. °· You may also want to ask your health care provider: °? Whether you will be able to hold or breastfeed your baby while you are still in the operating room. °? Whether your baby can stay with you immediately after the procedure and during your recovery. °? Whether a family member or a person of your choice can go with you into the operating room and stay with you during the procedure, immediately after the procedure, and during your recovery. °What happens during the procedure? ° °· An IV will be inserted into one of your veins. °· Fluid and medicines, such as antibiotics, will be given before the surgery. °· Fetal monitors will be placed on your abdomen to check your baby's heart rate. °· You may be given a special warming gown to wear to keep your temperature stable. °· A catheter may be inserted into your bladder through your urethra. This drains your urine during the procedure. °· You may be given one or more of the following: °? A medicine to numb the area (local anesthetic). °? A medicine to make you fall asleep (general anesthetic). °? A medicine (regional anesthetic) that is injected into your back or through a small thin tube placed in your back (spinal anesthetic or epidural anesthetic).   This numbs everything below the injection site and allows you to stay awake during your procedure. If this makes you feel nauseous, tell your health care provider. Medicines will be available to help reduce any nausea you may feel.  An incision will be made in your abdomen, and then in your uterus.  If you are awake during your procedure, you may feel tugging and pulling in  your abdomen, but you should not feel pain. If you feel pain, tell your health care provider immediately.  Your baby will be removed from your uterus. You may feel more pressure or pushing while this happens.  Immediately after birth, your baby will be dried and kept warm. You may be able to hold and breastfeed your baby.  The umbilical cord may be clamped and cut during this time. This usually occurs after waiting a period of 1-2 minutes after delivery.  Your placenta will be removed from your uterus.  Your incisions will be closed with stitches (sutures). Staples, skin glue, or adhesive strips may also be applied to the incision in your abdomen.  Bandages (dressings) may be placed over the incision in your abdomen. The procedure may vary among health care providers and hospitals. What happens after the procedure?  Your blood pressure, heart rate, breathing rate, and blood oxygen level will be monitored until you are discharged from the hospital.  You may continue to receive fluids and medicines through an IV.  You will have some pain. Medicines will be available to help control your pain.  To help prevent blood clots: ? You may be given medicines. ? You may have to wear compression stockings or devices. ? You will be encouraged to walk around when you are able.  Hospital staff will encourage and support bonding with your baby. Your hospital may have you and your baby to stay in the same room (rooming in) during your hospital stay to encourage successful bonding and breastfeeding.  You may be encouraged to cough and breathe deeply often. This helps to prevent lung problems.  If you have a catheter draining your urine, it will be removed as soon as possible after your procedure. Summary  Cesarean birth, or cesarean delivery, is the surgical delivery of a baby through an incision in the abdomen and the uterus.  Follow instructions from your health care provider about eating or  drinking restrictions before the procedure.  You will have some pain after the procedure. Medicines will be available to help control your pain.  Hospital staff will encourage and support bonding with your baby after the procedure. Your hospital may have you and your baby to stay in the same room (rooming in) during your hospital stay to encourage successful bonding and breastfeeding. This information is not intended to replace advice given to you by your health care provider. Make sure you discuss any questions you have with your health care provider. Document Released: 01/12/2005 Document Revised: 07/19/2017 Document Reviewed: 07/19/2017 Elsevier Interactive Patient Education  2019 [employer]  LABOR PRECAUTIONS   A team of health care providers will help you before, during, and after delivery. Birth experiences are unique for every woman and every pregnancy, and birth experiences vary depending on where you choose to give birth. What happens when I arrive at the birth center or hospital? Once you are in labor and have been admitted into the hospital or birth center, your health care provider may:  Review your pregnancy history and any concerns that you have.  Insert an  IV into one of your veins. This may be used to give you fluids and medicines.  Check your blood pressure, pulse, temperature, and heart rate (vital signs).  Check whether your bag of water (amniotic sac) has broken (ruptured).  Talk with you about your birth plan and discuss pain control options. Monitoring Your health care provider may monitor your contractions (uterine monitoring) and your baby's heart rate (fetal monitoring). You may need to be monitored:  Often, but not continuously (intermittently).  All the time or for long periods at a time (continuously). Continuous monitoring may be needed if: ? You are taking certain medicines, such as medicine to relieve pain or make your contractions stronger. ? You  have pregnancy or labor complications. Monitoring may be done by:  Placing a special stethoscope or a handheld monitoring device on your abdomen to check your baby's heartbeat and to check for contractions.  Placing monitors on your abdomen (external monitors) to record your baby's heartbeat and the frequency and length of contractions.  Placing monitors inside your uterus through your vagina (internal monitors) to record your baby's heartbeat and the frequency, length, and strength of your contractions. Depending on the type of monitor, it may remain in your uterus or on your baby's head until birth.  Telemetry. This is a type of continuous monitoring that can be done with external or internal monitors. Instead of having to stay in bed, you are able to move around during telemetry. Physical exam Your health care provider may perform frequent physical exams. This may include:  Checking how and where your baby is positioned in your uterus.  Checking your cervix to determine: ? Whether it is thinning out (effacing). ? Whether it is opening up (dilating). What happens during labor and delivery?  Normal labor and delivery is divided into the following three stages: Stage 1  This is the longest stage of labor.  This stage can last for hours or days.  Throughout this stage, you will feel contractions. Contractions generally feel mild, infrequent, and irregular at first. They get stronger, more frequent (about every 2-3 minutes), and more regular as you move through this stage.  This stage ends when your cervix is completely dilated to 4 inches (10 cm) and completely effaced. Stage 2  This stage starts once your cervix is completely effaced and dilated and lasts until the delivery of your baby.  This stage may last from 20 minutes to 2 hours.  This is the stage where you will feel an urge to push your baby out of your vagina.  You may feel stretching and burning pain, especially when  the widest part of your baby's head passes through the vaginal opening (crowning).  Once your baby is delivered, the umbilical cord will be clamped and cut. This usually occurs after waiting a period of 1-2 minutes after delivery.  Your baby will be placed on your bare chest (skin-to-skin contact) in an upright position and covered with a warm blanket. Watch your baby for feeding cues, like rooting or sucking, and help the baby to your breast for his or her first feeding. Stage 3  This stage starts immediately after the birth of your baby and ends after you deliver the placenta.  This stage may take anywhere from 5 to 30 minutes.  After your baby has been delivered, you will feel contractions as your body expels the placenta and your uterus contracts to control bleeding. Summary    Your health care provider may monitor your  contractions (uterine monitoring) and your baby's heart rate (fetal monitoring).  Your health care provider may perform a physical exam.  Normal labor and delivery is divided into three stages.  After labor is over, you and your baby will be monitored closely until you are ready to go home. This information is not intended to replace advice given to you by your health care provider. Make sure you discuss any questions you have with your health care provider.  Document Released: 10/22/2007 Document Revised: 02/16/2017 Document Reviewed: 02/16/2017 Elsevier Interactive Patient Education  2019 [employer].

## 2018-04-28 NOTE — [facility] Note (Signed)
Pt presents to [facility] with complaints of contractions with small amount of bloody show that started this morning. Scheduled repeat C/S on April the 8th

## 2018-04-28 NOTE — Discharge Instructions (Signed)
Fetal Movement Counts °Patient Name: ________________________________________________ Patient Due Date: ____________________ °What is a fetal movement count? ° °A fetal movement count is the number of times that you feel your baby move during a certain amount of time. This may also be called a fetal kick count. A fetal movement count is recommended for every pregnant woman. You may be asked to start counting fetal movements as early as week 28 of your pregnancy. °Pay attention to when your baby is most active. You may notice your baby's sleep and wake cycles. You may also notice things that make your baby move more. You should do a fetal movement count: °· When your baby is normally most active. °· At the same time each day. °A good time to count movements is while you are resting, after having something to eat and drink. °How do I count fetal movements? °1. Find a quiet, comfortable area. Sit, or lie down on your side. °2. Write down the date, the start time and stop time, and the number of movements that you felt between those two times. Take this information with you to your health care visits. °3. For 2 hours, count kicks, flutters, swishes, rolls, and jabs. You should feel at least 10 movements during 2 hours. °4. You may stop counting after you have felt 10 movements. °5. If you do not feel 10 movements in 2 hours, have something to eat and drink. Then, keep resting and counting for 1 hour. If you feel at least 4 movements during that hour, you may stop counting. °Contact a health care provider if: °· You feel fewer than 4 movements in 2 hours. °· Your baby is not moving like he or she usually does. °Date: ____________ Start time: ____________ Stop time: ____________ Movements: ____________ °Date: ____________ Start time: ____________ Stop time: ____________ Movements: ____________ °Date: ____________ Start time: ____________ Stop time: ____________ Movements: ____________ °Date: ____________ Start time:  ____________ Stop time: ____________ Movements: ____________ °Date: ____________ Start time: ____________ Stop time: ____________ Movements: ____________ °Date: ____________ Start time: ____________ Stop time: ____________ Movements: ____________ °Date: ____________ Start time: ____________ Stop time: ____________ Movements: ____________ °Date: ____________ Start time: ____________ Stop time: ____________ Movements: ____________ °Date: ____________ Start time: ____________ Stop time: ____________ Movements: ____________ °This information is not intended to replace advice given to you by your health care provider. Make sure you discuss any questions you have with your health care provider. °Document Released: 02/11/2006 Document Revised: 09/11/2015 Document Reviewed: 02/21/2015 °Elsevier Interactive Patient Education © 2019 [employer] °Signs and Symptoms of Labor °Labor is your body's natural process of moving your baby, placenta, and umbilical cord out of your uterus. The process of labor usually starts when your baby is full-term, between 37 and 40 weeks of pregnancy. °How will I know when I am close to going into labor? °As your body prepares for labor and the birth of your baby, you may notice the following symptoms in the weeks and days before true labor starts: °· Having a strong desire to get your home ready to receive your new baby. This is called nesting. Nesting may be a sign that labor is approaching, and it may occur several weeks before birth. Nesting may involve cleaning and organizing your home. °· Passing a small amount of thick, bloody mucus out of your vagina (normal bloody show or losing your mucus plug). This may happen more than a week before labor begins, or it might occur right before labor begins as the opening of the cervix   starts to widen (dilate). For some women, the entire mucus plug passes at once. For others, smaller portions of the mucus plug may gradually pass over several  days. °· Your baby moving (dropping) lower in your pelvis to get into position for birth (lightening). When this happens, you may feel more pressure on your bladder and pelvic bone and less pressure on your ribs. This may make it easier to breathe. It may also cause you to need to urinate more often and have problems with bowel movements. °· Having "practice contractions" (Braxton Hicks contractions) that occur at irregular (unevenly spaced) intervals that are more than 10 minutes apart. This is also called false labor. False labor contractions are common after exercise or sexual activity, and they will stop if you change position, rest, or drink fluids. These contractions are usually mild and do not get stronger over time. They may feel like: °? A backache or back pain. °? Mild cramps, similar to menstrual cramps. °? Tightening or pressure in your abdomen. °Other early symptoms that labor may be starting soon include: °· Nausea or loss of appetite. °· Diarrhea. °· Having a sudden burst of energy, or feeling very tired. °· Mood changes. °· Having trouble sleeping. °How will I know when labor has begun? °Signs that true labor has begun may include: °· Having contractions that come at regular (evenly spaced) intervals and increase in intensity. This may feel like more intense tightening or pressure in your abdomen that moves to your back. °? Contractions may also feel like rhythmic pain in your upper thighs or back that comes and goes at regular intervals. °? For first-time mothers, this change in intensity of contractions often occurs at a more gradual pace. °? Women who have given birth before may notice a more rapid progression of contraction changes. °· Having a feeling of pressure in the vaginal area. °· Your water breaking (rupture of membranes). This is when the sac of fluid that surrounds your baby breaks. When this happens, you will notice fluid leaking from your vagina. This may be clear or blood-tinged.  Labor usually starts within 24 hours of your water breaking, but it may take longer to begin. °? Some women notice this as a gush of fluid. °? Others notice that their underwear repeatedly becomes damp. °Follow these instructions at home: ° °· When labor starts, or if your water breaks, call your health care provider or nurse care line. Based on your situation, they will determine when you should go in for an exam. °· When you are in early labor, you may be able to rest and manage symptoms at home. Some strategies to try at home include: °? Breathing and relaxation techniques. °? Taking a warm bath or shower. °? Listening to music. °? Using a heating pad on the lower back for pain. If you are directed to use heat: °§ Place a towel between your skin and the heat source. °§ Leave the heat on for 20-30 minutes. °§ Remove the heat if your skin turns bright red. This is especially important if you are unable to feel pain, heat, or cold. You may have a greater risk of getting burned. °Get help right away if: °· You have painful, regular contractions that are 5 minutes apart or less. °· Labor starts before you are [redacted] weeks along in your pregnancy. °· You have a fever. °· You have a headache that does not go away. °· You have bright red blood coming from your vagina. °·   You do not feel your baby moving. °· You have a sudden onset of: °? Severe headache with vision problems. °? Nausea, vomiting, or diarrhea. °? Chest pain or shortness of breath. °These symptoms may be an emergency. If your health care provider recommends that you go to the hospital or birth center where you plan to deliver, do not drive yourself. Have someone else drive you, or call emergency services (911 in the [place] °Summary °· Labor is your body's natural process of moving your baby, placenta, and umbilical cord out of your uterus. °· The process of labor usually starts when your baby is full-term, between 37 and 40 weeks of pregnancy. °· When labor  starts, or if your water breaks, call your health care provider or nurse care line. Based on your situation, they will determine when you should go in for an exam. °This information is not intended to replace advice given to you by your health care provider. Make sure you discuss any questions you have with your health care provider. °Document Released: 06/19/2016 Document Revised: 06/19/2016 Document Reviewed: 06/19/2016 °Elsevier Interactive Patient Education © 2019 [employer] ° °

## 2018-04-28 NOTE — [facility] Note (Signed)
Contractions stronger and closer- approximately 5 minutes apart now.  No LOF or heavy bleeding.  + FM.  Was 3.5 cm earlier today.  Scheduled for repeat c/s on 4/8.

## 2018-04-28 NOTE — MAU Provider Note (Signed)
Chief Complaint:  Contractions    HPI: Cynthia English is a 33 y.o. G3P1011 at 53w2dwho presents to maternity admissions reporting painful contractions..She is scheduled for a repeat C/S.   She reports good fetal movement, denies LOF, vaginal bleeding, vaginal itching/burning, urinary symptoms, h/a, dizziness, n/v, diarrhea, constipation or fever/chills.  She denies headache, visual changes or RUQ abdominal pain.  RN Note: Contractions stronger and closer- approximately 5 minutes apart now.  No LOF or heavy bleeding.  + FM.  Was 3.5 cm earlier today.  Scheduled for repeat c/s on 4/8.  Past Medical History: Past Medical History:  Diagnosis Date  . Anemia   . Blood transfusion without reported diagnosis   . GERD (gastroesophageal reflux disease)   . Heart palpitations   . History of kidney stones   . Kidney stones   . Newborn product of in vitro fertilization (IVF) pregnancy     Past obstetric history: OB History  Gravida Para Term Preterm AB Living  3 1 1   1 1   SAB TAB Ectopic Multiple Live Births  1     0 1    # Outcome Date GA Lbr Len/2nd Weight Sex Delivery Anes PTL Lv  3 Current           2 Term 02/05/17 [redacted]w[redacted]d 04:01 / 02:58 3855 g M CS-Vac EPI  LIV  1 SAB             Past Surgical History: Past Surgical History:  Procedure Laterality Date  . CESAREAN SECTION N/A 02/05/2017   Procedure: CESAREAN SECTION;  Surgeon: Mitchel Honour, DO;  Location: [facility];  Service: Obstetrics;  Laterality: N/A;  . CHOLECYSTECTOMY N/A 05/12/2017   Procedure: LAPAROSCOPIC CHOLECYSTECTOMY;  Surgeon: Berna Bue, MD;  Location: [facility];  Service: General;  Laterality: N/A;  . DILATION AND CURETTAGE OF UTERUS    . FOOT SURGERY Bilateral   . PLANTAR FASCIA RELEASE    . toe rotation  2004    Family History: Family History  Problem Relation Age of Onset  . Arrhythmia Father   . Supraventricular tachycardia Father   . Hypertension Father   . Diabetes Paternal Aunt   . Breast  cancer Paternal Aunt   . Hypertension Maternal Grandmother   . Hypertension Paternal Grandmother   . Breast cancer Paternal Grandmother   . Colon cancer Paternal Grandfather     Social History: Social History   Tobacco Use  . Smoking status: Never Smoker  . Smokeless tobacco: Never Used  Substance Use Topics  . Alcohol use: No  . Drug use: No    Allergies:  Allergies  Allergen Reactions  . Prednisone Rash    Meds:  Medications Prior to Admission  Medication Sig Dispense Refill Last Dose  . diphenhydramine-acetaminophen (TYLENOL PM) 25-500 MG TABS tablet Take 2 tablets by mouth at bedtime as needed.   04/27/2018 at Unknown time  . Prenatal Vit-Fe Fumarate-FA (PRENATAL MULTIVITAMIN) TABS tablet Take 1 tablet by mouth daily at 12 noon. 30 tablet 6 04/27/2018 at Unknown time    I have reviewed patient's Past Medical Hx, Surgical Hx, Family Hx, Social Hx, medications and allergies.   ROS:  Review of Systems  Gastrointestinal: Positive for abdominal pain. Negative for constipation, diarrhea and nausea.  Genitourinary: Positive for pelvic pain. Negative for vaginal bleeding and vaginal discharge.   Other systems negative  Physical Exam   Patient Vitals for the past 24 hrs:  BP Temp Pulse Resp SpO2  04/28/18  2213 134/86 - 77 - -  04/28/18 1940 111/73 - 97 - -  04/28/18 1919 123/78 98.7 F (37.1 C) 77 19 100 %   Constitutional: Well-developed, well-nourished female in no acute distress.  Cardiovascular: normal rate and rhythm Respiratory: normal effort, clear to auscultation bilaterally GI: Abd soft, non-tender, gravid appropriate for gestational age.   No rebound or guarding. MS: Extremities nontender, no edema, normal ROM Neurologic: Alert and oriented x 4.  GU: Neg CVAT.  PELVIC EXAM: Dilation: 4 Effacement (%): 70 Cervical Position: Posterior Station: -3 Presentation: Vertex Exam by:: T Lytle RN   No change over time  FHT:  Baseline 140 , moderate variability,  accelerations present, no decelerations Contractions: q 8-9 mins Irregular     Labs: No results found for this or any previous visit (from the past 24 hour(s)). O/Negative/-- (09/11 0000)  Imaging:  No results found.  MAU Course/MDM: NST reviewed and is Category I  Since she was in so much pain, even though her cervix did not change after an hour and contractions were spaced out, I had RN give her another hour of monitoring.  Her cervix still did not change.   I gave her a single Percocet for pain which brought her pain down to a 3-4. Discharged home with strict return precautions.    Assessment: 1. Uterine contractions during pregnancy   2.     Previous C/S  Plan: Discharge home Strict Labor precautions and fetal kick counts Follow up in Office for prenatal visits and recheck of cervix  Follow-up Information    Marcelle Overlie, MD Follow up.   Specialty:  Obstetrics and Gynecology Contact information: [address] [place] [address] [phone]          Encouraged to return here or to other Urgent Care/ED if she develops worsening of symptoms, increase in pain, fever, or other concerning symptoms.   Pt stable at time of discharge.  Wynelle Bourgeois CNM, MSN Certified Nurse-Midwife 04/29/2018 6:49 AM

## 2018-04-28 NOTE — MAU Provider Note (Signed)
S: Patient is here for RN labor evaluation. Strip, vital signs, & chart Reviewed   O:  Vitals:   04/28/18 1145 04/28/18 1207 04/28/18 1401  BP: 139/87 121/79 129/79  Pulse: 96 92 89  Resp: 18  18  Temp: 98.2 F (36.8 C)    SpO2: 100%     No results found for this or any previous visit (from the past 24 hour(s)).  Dilation: 3.5 Effacement (%): 50 Cervical Position: Posterior Station: -3 Presentation: Vertex Exam by:: Dorrene German RN   FHR: 135 bpm, Mod Var, no Decels, 15x15 Accels UC: irregular   A: 1. False labor   2. [redacted] weeks gestation of pregnancy      P:  RN to discharge home in stable condition with return precautions & fetal kick counts  Judeth Horn FNP 2:13 PM

## 2018-04-29 ENCOUNTER — Inpatient Hospital Stay (HOSPITAL_COMMUNITY): Payer: BLUE CROSS/BLUE SHIELD | Admitting: Anesthesiology

## 2018-04-29 ENCOUNTER — Encounter (HOSPITAL_COMMUNITY): Payer: Self-pay

## 2018-04-29 ENCOUNTER — Inpatient Hospital Stay (HOSPITAL_COMMUNITY)
Admission: AD | Admit: 2018-04-29 | Discharge: 2018-05-01 | DRG: 788 | Disposition: A | Discharge status: Home / Self Care | Payer: BLUE CROSS/BLUE SHIELD | Attending: Obstetrics and Gynecology | Admitting: Obstetrics and Gynecology

## 2018-04-29 ENCOUNTER — Other Ambulatory Visit: Payer: Self-pay

## 2018-04-29 ENCOUNTER — Encounter (HOSPITAL_COMMUNITY)
Admission: AD | Disposition: A | Discharge status: Home / Self Care | Payer: Self-pay | Source: Home / Self Care | Attending: Obstetrics and Gynecology

## 2018-04-29 DIAGNOSIS — O34219 Maternal care for unspecified type scar from previous cesarean delivery: Secondary | ICD-10-CM | POA: Diagnosis not present

## 2018-04-29 DIAGNOSIS — Z3A38 38 weeks gestation of pregnancy: Secondary | ICD-10-CM | POA: Diagnosis not present

## 2018-04-29 DIAGNOSIS — O34211 Maternal care for low transverse scar from previous cesarean delivery: Principal | ICD-10-CM | POA: Diagnosis present

## 2018-04-29 DIAGNOSIS — O99214 Obesity complicating childbirth: Secondary | ICD-10-CM | POA: Diagnosis present

## 2018-04-29 DIAGNOSIS — Z98891 History of uterine scar from previous surgery: Secondary | ICD-10-CM

## 2018-04-29 DIAGNOSIS — Q825 Congenital non-neoplastic nevus: Secondary | ICD-10-CM | POA: Diagnosis not present

## 2018-04-29 DIAGNOSIS — Z3A00 Weeks of gestation of pregnancy not specified: Secondary | ICD-10-CM | POA: Diagnosis not present

## 2018-04-29 LAB — CBC
HCT: 39.2 % (ref 36.0–46.0)
Hemoglobin: 13.1 g/dL (ref 12.0–15.0)
MCH: 31.3 pg (ref 26.0–34.0)
MCHC: 33.4 g/dL (ref 30.0–36.0)
MCV: 93.6 fL (ref 80.0–100.0)
Platelets: 330 10*3/uL (ref 150–400)
RBC: 4.19 MIL/uL (ref 3.87–5.11)
RDW: 13.3 % (ref 11.5–15.5)
WBC: 13.8 10*3/uL — ABNORMAL HIGH (ref 4.0–10.5)
nRBC: 0 % (ref 0.0–0.2)

## 2018-04-29 LAB — TYPE AND SCREEN
ABO/RH(D): O NEG
Antibody Screen: NEGATIVE

## 2018-04-29 LAB — RPR: RPR Ser Ql: NONREACTIVE

## 2018-04-29 LAB — ABO/RH: ABO/RH(D): O NEG

## 2018-04-29 SURGERY — Surgical Case
Anesthesia: Spinal

## 2018-04-29 MED ORDER — COCONUT OIL OIL
1.0000 "application " | TOPICAL_OIL | Status: DC | PRN
  Administered 2018-04-30: 1 via TOPICAL

## 2018-04-29 MED ORDER — BUTORPHANOL TARTRATE 1 MG/ML IJ SOLN
1.0000 mg | Freq: Once | INTRAMUSCULAR | Status: AC
  Administered 2018-04-29: 08:00:00 1 mg via INTRAVENOUS
  Filled 2018-04-29: qty 1

## 2018-04-29 MED ORDER — PRENATAL MULTIVITAMIN CH
1.0000 | ORAL_TABLET | Freq: Every day | ORAL | Status: DC
  Administered 2018-04-30 – 2018-05-01 (×2): 1 via ORAL
  Filled 2018-04-29 (×2): qty 1

## 2018-04-29 MED ORDER — PHENYLEPHRINE HCL-NACL 20-0.9 MG/250ML-% IV SOLN
INTRAVENOUS | Status: DC | PRN
  Administered 2018-04-29: 60 ug/min via INTRAVENOUS

## 2018-04-29 MED ORDER — PHENYLEPHRINE HCL 10 MG/ML IJ SOLN
INTRAMUSCULAR | Status: DC | PRN
  Administered 2018-04-29 (×2): 40 ug via INTRAVENOUS

## 2018-04-29 MED ORDER — MEPERIDINE HCL 25 MG/ML IJ SOLN
INTRAMUSCULAR | Status: DC | PRN
  Administered 2018-04-29: 12.5 mg via INTRAVENOUS

## 2018-04-29 MED ORDER — BUPIVACAINE IN DEXTROSE 0.75-8.25 % IT SOLN
INTRATHECAL | Status: DC | PRN
  Administered 2018-04-29: 1.8 mL via INTRATHECAL

## 2018-04-29 MED ORDER — FAMOTIDINE 20 MG IN NS 100 ML IVPB
20.0000 mg | Freq: Once | INTRAVENOUS | Status: AC
  Administered 2018-04-29: 09:00:00 20 mg via INTRAVENOUS
  Filled 2018-04-29 (×2): qty 100

## 2018-04-29 MED ORDER — SODIUM CHLORIDE 0.9 % IV SOLN
INTRAVENOUS | Status: DC | PRN
  Administered 2018-04-29: 11:00:00 40 [IU] via INTRAVENOUS

## 2018-04-29 MED ORDER — SIMETHICONE 80 MG PO CHEW
80.0000 mg | CHEWABLE_TABLET | Freq: Three times a day (TID) | ORAL | Status: DC
  Administered 2018-04-29 – 2018-05-01 (×5): 80 mg via ORAL
  Filled 2018-04-29 (×5): qty 1

## 2018-04-29 MED ORDER — KETOROLAC TROMETHAMINE 30 MG/ML IJ SOLN
INTRAMUSCULAR | Status: AC
  Filled 2018-04-29: qty 1

## 2018-04-29 MED ORDER — SOD CITRATE-CITRIC ACID 500-334 MG/5ML PO SOLN
30.0000 mL | Freq: Once | ORAL | Status: AC
  Administered 2018-04-29: 07:00:00 30 mL via ORAL
  Filled 2018-04-29: qty 15

## 2018-04-29 MED ORDER — ACETAMINOPHEN 10 MG/ML IV SOLN: 1000.0000 mg | Freq: Once | INTRAVENOUS | Status: DC | PRN

## 2018-04-29 MED ORDER — FENTANYL CITRATE (PF) 100 MCG/2ML IJ SOLN
INTRAMUSCULAR | Status: DC | PRN
  Administered 2018-04-29: 50 ug via INTRAVENOUS
  Administered 2018-04-29: 15 ug via EPIDURAL
  Administered 2018-04-29: 50 ug via INTRAVENOUS
  Administered 2018-04-29: 85 ug via INTRAVENOUS

## 2018-04-29 MED ORDER — MEPERIDINE HCL 25 MG/ML IJ SOLN
INTRAMUSCULAR | Status: AC
  Filled 2018-04-29: qty 1

## 2018-04-29 MED ORDER — ONDANSETRON HCL 4 MG/2ML IJ SOLN
INTRAMUSCULAR | Status: AC
  Filled 2018-04-29: qty 2

## 2018-04-29 MED ORDER — SIMETHICONE 80 MG PO CHEW
80.0000 mg | CHEWABLE_TABLET | ORAL | Status: DC
  Administered 2018-04-30 (×2): 80 mg via ORAL
  Filled 2018-04-29 (×2): qty 1

## 2018-04-29 MED ORDER — OXYCODONE-ACETAMINOPHEN 5-325 MG PO TABS
1.0000 | ORAL_TABLET | ORAL | Status: DC | PRN
  Administered 2018-04-29: 1 via ORAL
  Administered 2018-04-30: 10:00:00 2 via ORAL
  Administered 2018-04-30: 1 via ORAL
  Administered 2018-04-30 – 2018-05-01 (×4): 2 via ORAL
  Filled 2018-04-29 (×2): qty 2
  Filled 2018-04-29: qty 1
  Filled 2018-04-29 (×3): qty 2
  Filled 2018-04-29: qty 1

## 2018-04-29 MED ORDER — ONDANSETRON HCL 4 MG/2ML IJ SOLN
INTRAMUSCULAR | Status: DC | PRN
  Administered 2018-04-29: 4 mg via INTRAVENOUS

## 2018-04-29 MED ORDER — WITCH HAZEL-GLYCERIN EX PADS: 1.0000 "application " | MEDICATED_PAD | CUTANEOUS | Status: DC | PRN

## 2018-04-29 MED ORDER — OXYTOCIN 40 UNITS IN NORMAL SALINE INFUSION - SIMPLE MED
INTRAVENOUS | Status: AC
  Filled 2018-04-29: qty 1000

## 2018-04-29 MED ORDER — FENTANYL CITRATE (PF) 100 MCG/2ML IJ SOLN
INTRAMUSCULAR | Status: AC
  Filled 2018-04-29: qty 2

## 2018-04-29 MED ORDER — MEDROXYPROGESTERONE ACETATE 150 MG/ML IM SUSP: 150.0000 mg | INTRAMUSCULAR | Status: DC | PRN

## 2018-04-29 MED ORDER — DIBUCAINE 1 % RE OINT: 1.0000 "application " | TOPICAL_OINTMENT | RECTAL | Status: DC | PRN

## 2018-04-29 MED ORDER — SENNOSIDES-DOCUSATE SODIUM 8.6-50 MG PO TABS
2.0000 | ORAL_TABLET | ORAL | Status: DC
  Administered 2018-04-30 (×2): 2 via ORAL
  Filled 2018-04-29 (×2): qty 2

## 2018-04-29 MED ORDER — LACTATED RINGERS IV SOLN
INTRAVENOUS | Status: DC
  Administered 2018-04-29 (×3): via INTRAVENOUS

## 2018-04-29 MED ORDER — KETOROLAC TROMETHAMINE 30 MG/ML IJ SOLN
30.0000 mg | Freq: Once | INTRAMUSCULAR | Status: AC | PRN
  Administered 2018-04-29: 30 mg via INTRAVENOUS

## 2018-04-29 MED ORDER — SODIUM CHLORIDE 0.9 % IR SOLN
Status: DC | PRN
  Administered 2018-04-29: 1

## 2018-04-29 MED ORDER — DEXTROSE IN LACTATED RINGERS 5 % IV SOLN
INTRAVENOUS | Status: DC
  Administered 2018-04-29: 21:00:00 via INTRAVENOUS

## 2018-04-29 MED ORDER — SODIUM CHLORIDE 0.9 % IV SOLN
INTRAVENOUS | Status: DC | PRN
  Administered 2018-04-29: 11:00:00 via INTRAVENOUS

## 2018-04-29 MED ORDER — ACETAMINOPHEN 325 MG PO TABS
650.0000 mg | ORAL_TABLET | Freq: Four times a day (QID) | ORAL | Status: DC | PRN
  Administered 2018-04-29: 650 mg via ORAL
  Filled 2018-04-29 (×2): qty 2

## 2018-04-29 MED ORDER — MORPHINE SULFATE (PF) 0.5 MG/ML IJ SOLN
INTRAMUSCULAR | Status: AC
  Filled 2018-04-29: qty 10

## 2018-04-29 MED ORDER — METOCLOPRAMIDE HCL 5 MG/ML IJ SOLN
INTRAMUSCULAR | Status: AC
  Filled 2018-04-29: qty 2

## 2018-04-29 MED ORDER — METOCLOPRAMIDE HCL 5 MG/ML IJ SOLN
INTRAMUSCULAR | Status: DC | PRN
  Administered 2018-04-29: 10 mg via INTRAVENOUS

## 2018-04-29 MED ORDER — CEFAZOLIN SODIUM-DEXTROSE 2-4 GM/100ML-% IV SOLN
2.0000 g | INTRAVENOUS | Status: AC
  Administered 2018-04-29: 10:00:00 3 g via INTRAVENOUS
  Filled 2018-04-29: qty 100

## 2018-04-29 MED ORDER — SIMETHICONE 80 MG PO CHEW: 80.0000 mg | CHEWABLE_TABLET | ORAL | Status: DC | PRN

## 2018-04-29 MED ORDER — TERBUTALINE SULFATE 1 MG/ML IJ SOLN
0.2500 mg | Freq: Once | INTRAMUSCULAR | Status: AC
  Administered 2018-04-29: 08:00:00 0.25 mg via SUBCUTANEOUS
  Filled 2018-04-29: qty 1

## 2018-04-29 MED ORDER — MENTHOL 3 MG MT LOZG: 1.0000 | LOZENGE | OROMUCOSAL | Status: DC | PRN

## 2018-04-29 MED ORDER — IBUPROFEN 600 MG PO TABS
600.0000 mg | ORAL_TABLET | Freq: Four times a day (QID) | ORAL | Status: DC | PRN
  Administered 2018-04-29 – 2018-05-01 (×6): 600 mg via ORAL
  Filled 2018-04-29 (×6): qty 1

## 2018-04-29 MED ORDER — TETANUS-DIPHTH-ACELL PERTUSSIS 5-2.5-18.5 LF-MCG/0.5 IM SUSP: 0.5000 mL | Freq: Once | INTRAMUSCULAR | Status: DC

## 2018-04-29 MED ORDER — MEASLES, MUMPS & RUBELLA VAC IJ SOLR: 0.5000 mL | Freq: Once | INTRAMUSCULAR | Status: DC

## 2018-04-29 MED ORDER — MORPHINE SULFATE (PF) 0.5 MG/ML IJ SOLN
INTRAMUSCULAR | Status: DC | PRN
  Administered 2018-04-29: .15 mg via EPIDURAL

## 2018-04-29 MED ORDER — OXYTOCIN 40 UNITS IN NORMAL SALINE INFUSION - SIMPLE MED: 2.5000 [IU]/h | INTRAVENOUS | Status: AC

## 2018-04-29 MED ORDER — DIPHENHYDRAMINE HCL 25 MG PO CAPS: 25.0000 mg | ORAL_CAPSULE | Freq: Four times a day (QID) | ORAL | Status: DC | PRN

## 2018-04-29 SURGICAL SUPPLY — 37 items
ADH SKN CLS APL DERMABOND .7 (GAUZE/BANDAGES/DRESSINGS)
APL SKNCLS STERI-STRIP NONHPOA (GAUZE/BANDAGES/DRESSINGS) ×1
BENZOIN TINCTURE PRP APPL 2/3 (GAUZE/BANDAGES/DRESSINGS) ×2 IMPLANT
CHLORAPREP W/TINT 26ML (MISCELLANEOUS) ×2 IMPLANT
CLAMP CORD UMBIL (MISCELLANEOUS) IMPLANT
CLOTH BEACON ORANGE TIMEOUT ST (SAFETY) ×2 IMPLANT
DERMABOND ADVANCED (GAUZE/BANDAGES/DRESSINGS)
DERMABOND ADVANCED .7 DNX12 (GAUZE/BANDAGES/DRESSINGS) IMPLANT
DRSG OPSITE POSTOP 4X10 (GAUZE/BANDAGES/DRESSINGS) ×2 IMPLANT
ELECT REM PT RETURN 9FT ADLT (ELECTROSURGICAL) ×2
ELECTRODE REM PT RTRN 9FT ADLT (ELECTROSURGICAL) ×1 IMPLANT
EXTRACTOR VACUUM KIWI (MISCELLANEOUS) IMPLANT
GLOVE BIO SURGEON STRL SZ 6 (GLOVE) ×2 IMPLANT
GLOVE BIOGEL PI IND STRL 6 (GLOVE) ×2 IMPLANT
GLOVE BIOGEL PI IND STRL 7.0 (GLOVE) ×1 IMPLANT
GLOVE BIOGEL PI INDICATOR 6 (GLOVE) ×2
GLOVE BIOGEL PI INDICATOR 7.0 (GLOVE) ×1
GOWN STRL REUS W/TWL LRG LVL3 (GOWN DISPOSABLE) ×4 IMPLANT
KIT ABG SYR 3ML LUER SLIP (SYRINGE) ×2 IMPLANT
NDL HYPO 25X5/8 SAFETYGLIDE (NEEDLE) ×1 IMPLANT
NEEDLE HYPO 25X5/8 SAFETYGLIDE (NEEDLE) ×2 IMPLANT
NS IRRIG 1000ML POUR BTL (IV SOLUTION) ×2 IMPLANT
PACK C SECTION WH (CUSTOM PROCEDURE TRAY) ×2 IMPLANT
PAD ABD 7.5X8 STRL (GAUZE/BANDAGES/DRESSINGS) ×2 IMPLANT
PAD OB MATERNITY 4.3X12.25 (PERSONAL CARE ITEMS) ×2 IMPLANT
PENCIL SMOKE EVAC W/HOLSTER (ELECTROSURGICAL) ×2 IMPLANT
STRIP CLOSURE SKIN 1/2X4 (GAUZE/BANDAGES/DRESSINGS) IMPLANT
STRIP CLOSURE SKIN 1/4X4 (GAUZE/BANDAGES/DRESSINGS) ×1 IMPLANT
SUT CHROMIC 0 CTX 36 (SUTURE) ×6 IMPLANT
SUT MON AB 2-0 CT1 27 (SUTURE) ×2 IMPLANT
SUT PDS AB 0 CT1 27 (SUTURE) IMPLANT
SUT PLAIN 0 NONE (SUTURE) IMPLANT
SUT VIC AB 0 CT1 36 (SUTURE) IMPLANT
SUT VIC AB 4-0 KS 27 (SUTURE) IMPLANT
TOWEL OR 17X24 6PK STRL BLUE (TOWEL DISPOSABLE) ×2 IMPLANT
TRAY FOLEY W/BAG SLVR 14FR LF (SET/KITS/TRAYS/PACK) IMPLANT
WATER STERILE IRR 1000ML POUR (IV SOLUTION) ×2 IMPLANT

## 2018-04-29 NOTE — Anesthesia Preprocedure Evaluation (Addendum)
Anesthesia Evaluation  Patient identified by MRN, date of birth, ID band Patient awake    Reviewed: Allergy & Precautions, NPO status , Patient's Chart, lab work & pertinent test results  Airway Mallampati: II  TM Distance: >3 FB Neck ROM: Full    Dental no notable dental hx. (+) Teeth Intact, Dental Advisory Given   Pulmonary neg pulmonary ROS,    Pulmonary exam normal breath sounds clear to auscultation       Cardiovascular negative cardio ROS Normal cardiovascular exam Rhythm:Regular Rate:Normal     Neuro/Psych negative neurological ROS  negative psych ROS   GI/Hepatic Neg liver ROS, GERD  ,  Endo/Other  Morbid obesity  Renal/GU negative Renal ROS  negative genitourinary   Musculoskeletal negative musculoskeletal ROS (+)   Abdominal   Peds  Hematology  (+) Blood dyscrasia, anemia ,   Anesthesia Other Findings   Reproductive/Obstetrics negative OB ROS                             Anesthesia Physical Anesthesia Plan  ASA: III  Anesthesia Plan: Spinal   Post-op Pain Management:    Induction:   PONV Risk Score and Plan: Treatment may vary due to age or medical condition  Airway Management Planned: Natural Airway  Additional Equipment:   Intra-op Plan:   Post-operative Plan:   Informed Consent: I have reviewed the patients History and Physical, chart, labs and discussed the procedure including the risks, benefits and alternatives for the proposed anesthesia with the patient or authorized representative who has indicated his/her understanding and acceptance.     Dental advisory given  Plan Discussed with: CRNA  Anesthesia Plan Comments:         Anesthesia Quick Evaluation

## 2018-04-29 NOTE — Anesthesia Postprocedure Evaluation (Signed)
Anesthesia Post Note  Patient: Cynthia English  Procedure(s) Performed: CESAREAN SECTION (N/A )     Patient location during evaluation: Mother Baby Anesthesia Type: Spinal Level of consciousness: awake, awake and alert and oriented Pain management: pain level controlled Vital Signs Assessment: post-procedure vital signs reviewed and stable Respiratory status: spontaneous breathing, nonlabored ventilation and respiratory function stable Cardiovascular status: stable Postop Assessment: no headache, no backache, adequate PO intake, no apparent nausea or vomiting and patient able to bend at knees Anesthetic complications: no    Last Vitals:  Vitals:   04/29/18 1355 04/29/18 1500  BP: 106/60 (!) 108/57  Pulse: 72 71  Resp: 18 18  Temp: 36.9 C 36.7 C  SpO2: 96% 97%    Last Pain:  Vitals:   04/29/18 1500  TempSrc: Oral  PainSc: 2    Pain Goal: Patients Stated Pain Goal: 3 (04/29/18 1500)                 Barack Nicodemus

## 2018-04-29 NOTE — MAU Note (Signed)
I have communicated with Wynelle Bourgeois, CNM and reviewed vital signs:  Vitals:   04/28/18 1940 04/28/18 2213  BP: 111/73 134/86  Pulse: 97 77  Resp:    Temp:    SpO2:      Vaginal exam:  Dilation: 4 Effacement (%): 70 Cervical Position: Posterior Station: -3 Presentation: Vertex Exam by:: T Velia Meyer RN ,   Also reviewed contraction pattern and that non-stress test is reactive.  It has been documented that patient is contracting irregularly with little cervical change over 2 hours not indicating active labor.  Patient denies any other complaints.  Based on this report provider has given order for discharge.  A discharge order and diagnosis entered by a provider.   Labor discharge instructions reviewed with patient.

## 2018-04-29 NOTE — Anesthesia Procedure Notes (Signed)
Spinal  Patient location during procedure: OR Start time: 04/29/2018 9:53 AM End time: 04/29/2018 10:03 AM Staffing Anesthesiologist: Elmer Picker, MD Performed: anesthesiologist  Preanesthetic Checklist Completed: patient identified, surgical consent, pre-op evaluation, timeout performed, IV checked, risks and benefits discussed and monitors and equipment checked Spinal Block Patient position: sitting Prep: site prepped and draped and DuraPrep Patient monitoring: cardiac monitor, continuous pulse ox and blood pressure Approach: midline Location: L3-4 Injection technique: single-shot Needle Needle type: Pencan  Needle gauge: 24 G Needle length: 9 cm Assessment Sensory level: T6 Additional Notes Functioning IV was confirmed and monitors were applied. Sterile prep and drape, including hand hygiene and sterile gloves were used. The patient was positioned and the spine was prepped. The skin was anesthetized with lidocaine.  Free flow of clear CSF was obtained prior to injecting local anesthetic into the CSF.  The spinal needle aspirated freely following injection.  The needle was carefully withdrawn.  The patient tolerated the procedure well.

## 2018-04-29 NOTE — Transfer of Care (Signed)
Immediate Anesthesia Transfer of Care Note  Patient: Cynthia English  Procedure(s) Performed: CESAREAN SECTION (N/A )  Patient Location: PACU  Anesthesia Type:Spinal  Level of Consciousness: awake, alert  and oriented  Airway & Oxygen Therapy: Patient Spontanous Breathing  Post-op Assessment: Report given to RN and Post -op Vital signs reviewed and stable  Post vital signs: Reviewed and stable  Last Vitals:  Vitals Value Taken Time  BP 125/79 04/29/2018 11:24 AM  Temp    Pulse 89 04/29/2018 11:28 AM  Resp 13 04/29/2018 11:28 AM  SpO2 97 % 04/29/2018 11:28 AM  Vitals shown include unvalidated device data.  Last Pain:  Vitals:   04/29/18 1124  TempSrc: (P) Oral  PainSc: (P) 0-No pain      Patients Stated Pain Goal: 2 (04/29/18 3524)  Complications: No apparent anesthesia complications

## 2018-04-29 NOTE — Lactation Note (Signed)
This note was copied from a baby's chart. Lactation Consultation Note  Patient Name: Cynthia English CHENI'D Date: 04/29/2018 Reason for consult: Initial assessment;Early term 37-38.6wks  Infant in blankets with dad.  Mom states infant is feeding much better than first child.  Her 7 month old breastfed 6 months.   Mom pumped often from beginning due to poor latch.  LC offered assistance with latch.  Suggested feeding STS.  Mom is able to hand exp.  Drops seen easily.  Infant latched with lips flanged But mom felt slight pinch.  Lc reviewed how to break latch.  Assisted with getting a deeper latch using more head/neck support for infant.  Pillows provided for extra support to keep baby close to breast.  Mom states she doesn't feel any pinching after relatch.  Baby fed for 5 minutes with 2 swallows heard.  Very sleepy once latched.  Dad burped baby and mom attempted in football hold on other side, infant latched well and rhythmic sucking noted.  Mom does have a small red bruise, what she called a hickie on her left areola where infant "missed" earlier.    BF basics reviewed with mom and dad.  At least 8-12 feeds in 24 hours, feeding cues, waking techniques, and signs of active feeding reviewed.  Mom had pump questions LC answered.  Lactation brochure provided and LC encouraged mom to call for any questions or concerns after delivery.  Op services explained as well. Maternal Data Has patient been taught Hand Expression?: Yes Does the patient have breastfeeding experience prior to this delivery?: Yes  Feeding Feeding Type: Breast Fed  LATCH Score Latch: Grasps breast easily, tongue down, lips flanged, rhythmical sucking.  Audible Swallowing: A few with stimulation  Type of Nipple: Everted at rest and after stimulation  Comfort (Breast/Nipple): Soft / non-tender  Hold (Positioning): Assistance needed to correctly position infant at breast and maintain latch.  LATCH Score:  8  Interventions Interventions: Breast feeding basics reviewed;Assisted with latch;Skin to skin;Breast massage;Hand express;Support pillows;Position options;Adjust position  Lactation Tools Discussed/Used     Consult Status Consult Status: Follow-up Follow-up type: In-patient    Maryruth Hancock [facility] 04/29/2018, 7:58 PM

## 2018-04-29 NOTE — H&P (Signed)
Cynthia English is a 33 y.o. female presenting for SOL.  Pt c/o contractions that have increased overnight.  No vb or LOF.  + FM.  Pregnancy uncomplicated.    OB History    Gravida  3   Para  1   Term  1   Preterm      AB  1   Living  1     SAB  1   TAB      Ectopic      Multiple  0   Live Births  1          Past Medical History:  Diagnosis Date  . Anemia   . Blood transfusion without reported diagnosis   . GERD (gastroesophageal reflux disease)   . Heart palpitations   . History of kidney stones   . Kidney stones   . Newborn product of in vitro fertilization (IVF) pregnancy    Past Surgical History:  Procedure Laterality Date  . CESAREAN SECTION N/A 02/05/2017   Procedure: CESAREAN SECTION;  Surgeon: Mitchel Honour, DO;  Location: [facility];  Service: Obstetrics;  Laterality: N/A;  . CHOLECYSTECTOMY N/A 05/12/2017   Procedure: LAPAROSCOPIC CHOLECYSTECTOMY;  Surgeon: Berna Bue, MD;  Location: [facility];  Service: General;  Laterality: N/A;  . DILATION AND CURETTAGE OF UTERUS    . FOOT SURGERY Bilateral   . PLANTAR FASCIA RELEASE    . toe rotation  2004   Family History: family history includes Arrhythmia in her father; Breast cancer in her paternal aunt and paternal grandmother; Colon cancer in her paternal grandfather; Diabetes in her paternal aunt; Hypertension in her father, maternal grandmother, and paternal grandmother; Supraventricular tachycardia in her father. Social History:  reports that she has never smoked. She has never used smokeless tobacco. She reports that she does not drink alcohol or use drugs.     Maternal Diabetes: No Genetic Screening: Declined Maternal Ultrasounds/Referrals: Normal Fetal Ultrasounds or other Referrals:  None Maternal Substance Abuse:  No Significant Maternal Medications:  None Significant Maternal Lab Results:  None Other Comments:  None  ROS History Dilation: 6 Effacement (%): 80 Station: -2,  -3 Exam by:: [employer] RN Blood pressure 124/79, pulse 88, temperature 98.1 F (36.7 C), resp. rate 18, last menstrual period 07/29/2017, currently breastfeeding. Exam Physical Exam  Gen - uncomfortable w/ ctx Abd - gravid, NT  EFW 9# Ext - NT  Prenatal labs: ABO, Rh: --/--/O NEG (04/03 0650) Antibody: NEG (04/03 0650) Rubella: Immune (09/11 0000) RPR: Nonreactive (09/11 0000)  HBsAg: Negative (09/11 0000)  HIV: Non-reactive (09/11 0000)  GBS:     Assessment/Plan: Admit Repeat c-section terb & stadol for pain  R/b/a discussed, questions answered, informed consent   Zelphia Cairo 04/29/2018, 8:08 AM

## 2018-04-29 NOTE — Op Note (Signed)
Cesarean Section Procedure Note   Cynthia English  04/29/2018  Indications: previous, labor   Pre-operative Diagnosis: REPEAT CESAREAN SECTION.   Post-operative Diagnosis: Same   Surgeon: Surgeon(s) and Role:     Zelphia Cairo, MD   Assistants: none  Anesthesia: spinal   Procedure Details:  The patient was seen in the Holding Room. The risks, benefits, complications, treatment options, and expected outcomes were discussed with the patient. The patient concurred with the proposed plan, giving informed consent. identified as Cynthia English and the procedure verified as C-Section Delivery. A Time Out was held and the above information confirmed.  After induction of anesthesia, the patient was draped and prepped in the usual sterile manner. A transverse was made and carried down through the subcutaneous tissue to the fascia. Fascial incision was made and extended transversely. The fascia was separated from the underlying rectus tissue superiorly and inferiorly. The peritoneum was identified and entered. Peritoneal incision was extended longitudinally. The utero-vesical peritoneal reflection was incised transversely and the bladder flap was bluntly freed from the lower uterine segment. A low transverse uterine incision was made. Delivered from cephalic presentation was a viable female infant. Cord ph was not sent the umbilical cord was clamped and cut cord blood was obtained for evaluation. The placenta was removed Intact and appeared normal. The uterine outline, tubes and ovaries appeared normal}. The uterine incision was closed with running locked sutures of 0chromic gut.   Hemostasis was observed. Lavage was carried out until clear. The fascia was then reapproximated with running sutures of 0PDS. The skin was closed with 4-0Vicryl.   Instrument, sponge, and needle counts were correct prior the abdominal closure and were correct at the conclusion of the case.    Findings:   Estimated Blood  Loss: 529 mL   Urine Output: clear  Specimens: placenta for disposal  Complications: no complications  Disposition: PACU - hemodynamically stable.   Maternal Condition: stable   Baby condition / location:  Couplet care / Skin to Skin  Attending Attestation: I was present and scrubbed for the entire procedure.   Signed: Surgeon(s): Mitchel Honour, DO Zelphia Cairo, MD

## 2018-04-30 ENCOUNTER — Encounter (HOSPITAL_COMMUNITY): Payer: Self-pay | Admitting: Obstetrics & Gynecology

## 2018-04-30 LAB — CBC
HCT: 31.4 % — ABNORMAL LOW (ref 36.0–46.0)
Hemoglobin: 10.4 g/dL — ABNORMAL LOW (ref 12.0–15.0)
MCH: 32.1 pg (ref 26.0–34.0)
MCHC: 33.1 g/dL (ref 30.0–36.0)
MCV: 96.9 fL (ref 80.0–100.0)
Platelets: 243 10*3/uL (ref 150–400)
RBC: 3.24 MIL/uL — ABNORMAL LOW (ref 3.87–5.11)
RDW: 13.4 % (ref 11.5–15.5)
WBC: 12.4 10*3/uL — ABNORMAL HIGH (ref 4.0–10.5)
nRBC: 0 % (ref 0.0–0.2)

## 2018-04-30 MED ORDER — DEXTROSE 5 % IV SOLN
INTRAVENOUS | Status: AC
  Filled 2018-04-30: qty 3000

## 2018-04-30 MED ORDER — RHO D IMMUNE GLOBULIN 1500 UNIT/2ML IJ SOSY
300.0000 ug | PREFILLED_SYRINGE | Freq: Once | INTRAMUSCULAR | Status: AC
  Administered 2018-04-30: 300 ug via INTRAVENOUS
  Filled 2018-04-30: qty 2

## 2018-04-30 NOTE — Lactation Note (Signed)
This note was copied from a baby's chart. Lactation Consultation Note  Patient Name: Cynthia English QHUTM'L Date: 04/30/2018 Reason for consult: Follow-up assessment;Infant weight loss;Early term 57-38.6wks  26 hours old FT female who is being fully BF by her mother, she's a P2. Mom called for latch assistance, LC asked parents if it's OK to do STS and they both agreed; dad undressed baby; she's at 3% weight loss. LC took baby STS to mother's breast in football position per her request and she was able to latch after a few attempts, once she did, she required very little stimulation to keep her sucking; baby fed for 10 minutes with a few audible swallows heard upon breast compressions.   Parents had lots of questions about feeding, discussed feeding cues, low milk supply (mom's first pregnancy was with IVF), baby's sleeping cycle and normal newborn behavior. Dad present and supportive, he's also been doing STS with baby. Mom ready to burp baby when exiting the room after she self-released from the breast. Both parents reported all questions and concerns were answered, they're both aware of LC services and will call PRN.  Maternal Data    Feeding Feeding Type: Breast Fed  LATCH Score Latch: Grasps breast easily, tongue down, lips flanged, rhythmical sucking.  Audible Swallowing: A few with stimulation  Type of Nipple: Everted at rest and after stimulation  Comfort (Breast/Nipple): Soft / non-tender  Hold (Positioning): Assistance needed to correctly position infant at breast and maintain latch.  LATCH Score: 8  Interventions Interventions: Breast feeding basics reviewed;Assisted with latch;Skin to skin;Hand express;Breast compression;Adjust position;Support pillows  Lactation Tools Discussed/Used     Consult Status Consult Status: Follow-up Date: 05/01/18 Follow-up type: In-patient    [name] 04/30/2018, 1:28 PM

## 2018-04-30 NOTE — Lactation Note (Signed)
This note was copied from a baby's chart. Lactation Consultation Note  Patient Name: Cynthia English [id number] Date: 04/30/2018 Reason for consult: Follow-up assessment;Early term 75-38.6wks Baby is 24 hours old/3% weight loss.  Mom reports that baby has not latched as easily the past few feeds.  Baby just finished a feeding and is sleeping on Mom's chest.  Reviewed normal newborn feeding.  Instructed to feed with cues and call for assist prn.  Maternal Data    Feeding Feeding Type: Breast Fed  LATCH Score Latch: Grasps breast easily, tongue down, lips flanged, rhythmical sucking.  Audible Swallowing: A few with stimulation  Type of Nipple: Everted at rest and after stimulation  Comfort (Breast/Nipple): Soft / non-tender  Hold (Positioning): No assistance needed to correctly position infant at breast.  LATCH Score: 9  Interventions    Lactation Tools Discussed/Used     Consult Status Consult Status: Follow-up Date: 05/01/18 Follow-up type: In-patient    Huston Foley 04/30/2018, 10:54 AM

## 2018-04-30 NOTE — Progress Notes (Addendum)
Post Partum Day 1 Subjective: no complaints  Objective: Blood pressure 110/67, pulse 75, temperature 97.8 F (36.6 C), temperature source Oral, resp. rate 18, last menstrual period 07/29/2017, SpO2 96 %, unknown if currently breastfeeding.  Physical Exam:  General: alert and cooperative Lochia: appropriate Uterine Fundus: firm Incision: c/d/i DVT Evaluation: No evidence of DVT seen on physical exam.  Recent Labs    04/29/18 0725 04/30/18 0418  HGB 13.1 10.4*  HCT 39.2 31.4*    Assessment/Plan: Plan for discharge tomorrow   LOS: 1 day   Cynthia English 04/30/2018, 9:06 AM

## 2018-05-01 LAB — RH IG WORKUP (INCLUDES ABO/RH)
ABO/RH(D): O NEG
Fetal Screen: NEGATIVE
Gestational Age(Wks): 38
Unit division: 0

## 2018-05-01 MED ORDER — OXYCODONE-ACETAMINOPHEN 5-325 MG PO TABS: 1.0000 | ORAL_TABLET | ORAL | 0 refills | Status: DC | PRN

## 2018-05-01 MED ORDER — IBUPROFEN 600 MG PO TABS: 600.0000 mg | ORAL_TABLET | Freq: Four times a day (QID) | ORAL | 0 refills | Status: DC | PRN

## 2018-05-01 NOTE — Lactation Note (Signed)
This note was copied from a baby's chart. Lactation Consultation Note  Patient Name: Girl Christean Rezek [id number] Date: 05/01/2018 Reason for consult: Follow-up assessment;Early term 64-38.6wks  P2 mother whose infant is now 6 hours old.  Mother breast fed her first child (now 15 months) for 6 months.  This is an ETI at 38+2 weeks.  Mother holding baby when I arrived.  Mother had no questions/concerns related to breast feeding.  RN had helped mother latch in the cross cradle hold earlier today and mother feels confident about this hold.    Answered many basic breast feeding related questions the parents had.  Discussed pumping and milk storage also.  Mother will return to work in 8 weeks and family has purchased a deep freeze for milk storage.    Encouraged to continue feeding with cues or at least every 3 hours due to baby being an ETI.  Mother will continue hand expression before/after feedings to help increase milk supply.  Discussed with mother that she can pump after feedings if she feels like baby is not being satisfied or if she would like to increase milk supply but that it is not mandatory to pump.  She will be returning to the pediatrician tomorrow for the first visit.   Father is very involved and supportive.  Mother has our OP phone number for questions/concerns after discharge.     Maternal Data Formula Feeding for Exclusion: No Has patient been taught Hand Expression?: Yes Does the patient have breastfeeding experience prior to this delivery?: Yes  Feeding Feeding Type: Breast Fed  LATCH Score Latch: Grasps breast easily, tongue down, lips flanged, rhythmical sucking.  Audible Swallowing: Spontaneous and intermittent  Type of Nipple: Everted at rest and after stimulation  Comfort (Breast/Nipple): Soft / non-tender  Hold (Positioning): Assistance needed to correctly position infant at breast and maintain latch.  LATCH Score: 9  Interventions    Lactation Tools  Discussed/Used WIC Program: No   Consult Status Consult Status: Complete Date: 05/01/18 Follow-up type: Call as needed    Neizan Debruhl R Shariya Gaster 05/01/2018, 1:13 PM

## 2018-05-01 NOTE — Discharge Summary (Signed)
Obstetric Discharge Summary Reason for Admission: onset of labor Prenatal Procedures: ultrasound Intrapartum Procedures: cesarean: low cervical, transverse Postpartum Procedures: none Complications-Operative and Postpartum: none Hemoglobin  Date Value Ref Range Status  04/30/2018 10.4 (L) 12.0 - 15.0 g/dL Final    Comment:    REPEATED TO VERIFY SPECIMEN CHECKED FOR CLOTS DELTA CHECK NOTED    HCT  Date Value Ref Range Status  04/30/2018 31.4 (L) 36.0 - 46.0 % Final    Physical Exam:  General: alert and cooperative Lochia: appropriate Uterine Fundus: firm Incision: healing well, no significant drainage DVT Evaluation: No evidence of DVT seen on physical exam.  Discharge Diagnoses: Term Pregnancy-delivered  Discharge Information: Date: 05/01/2018 Activity: pelvic rest Diet: routine Medications: PNV, Ibuprofen and Percocet Condition: stable Instructions: refer to practice specific booklet Discharge to: home Follow-up Information    [place], [facility] Schedule an appointment as soon as possible for a visit in 6 week(s).   Contact information: [address] [place] [address] [phone]           Newborn Data: Live born female  Birth Weight: 7 lb 9.5 oz (3445 g) APGAR: 9, 9  Newborn Delivery   Birth date/time:  04/29/2018 10:30:00 Delivery type:  C-Section, Low Transverse Trial of labor:  No C-section categorization:  Repeat     Home with mother.  Cynthia English 05/01/2018, 8:36 AM

## 2018-05-03 ENCOUNTER — Encounter
Admission: RE | Admit: 2018-05-03 | Discharge: 2018-05-03 | Disposition: A | Discharge status: Home / Self Care | Payer: BLUE CROSS/BLUE SHIELD | Source: Ambulatory Visit

## 2018-05-03 HISTORY — DX: Encounter for other specified aftercare: Z51.89

## 2018-05-04 ENCOUNTER — Inpatient Hospital Stay (HOSPITAL_COMMUNITY)
Admission: RE | Admit: 2018-05-04 | Payer: BLUE CROSS/BLUE SHIELD | Source: Home / Self Care | Admitting: Obstetrics & Gynecology

## 2018-05-04 ENCOUNTER — Inpatient Hospital Stay (HOSPITAL_COMMUNITY): Admission: AD | Admit: 2018-05-04 | Payer: BLUE CROSS/BLUE SHIELD | Source: Home / Self Care

## 2018-06-09 DIAGNOSIS — Z1389 Encounter for screening for other disorder: Secondary | ICD-10-CM | POA: Diagnosis not present

## 2018-07-22 ENCOUNTER — Telehealth: Payer: Self-pay | Admitting: Cardiology

## 2018-07-22 NOTE — Telephone Encounter (Signed)
Virtual Visit Pre-Appointment Phone Call  "(Name), I am calling you today to discuss your upcoming appointment. We are currently trying to limit exposure to the virus that causes COVID-19 by seeing patients at home rather than in the office."  1. "What is the BEST phone number to call the day of the visit?" - include this in appointment notes  2. Do you have or have access to (through a family member/friend) a smartphone with video capability that we can use for your visit?" a. If yes - list this number in appt notes as cell (if different from BEST phone #) and list the appointment type as a VIDEO visit in appointment notes b. If no - list the appointment type as a PHONE visit in appointment notes  3. Confirm consent - "In the setting of the current Covid19 crisis, you are scheduled for a (phone or video) visit with your provider on (date) at (time).  Just as we do with many in-office visits, in order for you to participate in this visit, we must obtain consent.  If you'd like, I can send this to your mychart (if signed up) or email for you to review.  Otherwise, I can obtain your verbal consent now.  All virtual visits are billed to your insurance company just like a normal visit would be.  By agreeing to a virtual visit, we'd like you to understand that the technology does not allow for your provider to perform an examination, and thus may limit your provider's ability to fully assess your condition. If your provider identifies any concerns that need to be evaluated in person, we will make arrangements to do so.  Finally, though the technology is pretty good, we cannot assure that it will always work on either your or our end, and in the setting of a video visit, we may have to convert it to a phone-only visit.  In either situation, we cannot ensure that we have a secure connection.  Are you willing to proceed?" STAFF: Did the patient verbally acknowledge consent to telehealth visit? Document  YES/NO here: YES  4. Advise patient to be prepared - "Two hours prior to your appointment, go ahead and check your blood pressure, pulse, oxygen saturation, and your weight (if you have the equipment to check those) and write them all down. When your visit starts, your provider will ask you for this information. If you have an Apple Watch or Kardia device, please plan to have heart rate information ready on the day of your appointment. Please have a pen and paper handy nearby the day of the visit as well."  5. Give patient instructions for MyChart download to smartphone OR Doximity/Doxy.me as below if video visit (depending on what platform provider is using)  6. Inform patient they will receive a phone call 15 minutes prior to their appointment time (may be from unknown caller ID) so they should be prepared to answer    TELEPHONE CALL NOTE  Cynthia English has been deemed a candidate for a follow-up tele-health visit to limit community exposure during the Covid-19 pandemic. I spoke with the patient via phone to ensure availability of phone/video source, confirm preferred email & phone number, and discuss instructions and expectations.  I reminded Cynthia English to be prepared with any vital sign and/or heart rhythm information that could potentially be obtained via home monitoring, at the time of her visit. I reminded Cynthia English to expect a phone call prior to  her visit.  Minette BrineLisa B Welch 07/22/2018 4:48 PM   INSTRUCTIONS FOR DOWNLOADING THE MYCHART APP TO SMARTPHONE  - The patient must first make sure to have activated MyChart and know their login information - If Apple, go to [employer] Store and type in MyChart in the search bar and download the app. If Android, ask patient to go to [employer] Play Store and type in [place] in the search bar and download the app. The app is free but as with any other app downloads, their phone may require them to verify saved payment information or Apple/Android password.    - The patient will need to then log into the app with their MyChart username and password, and select  as their healthcare provider to link the account. When it is time for your visit, go to the MyChart app, find appointments, and click Begin Video Visit. Be sure to Select Allow for your device to access the Microphone and Camera for your visit. You will then be connected, and your provider will be with you shortly.  **If they have any issues connecting, or need assistance please contact MyChart service desk [phone])**  **If using a computer, in order to ensure the best quality for their visit they will need to use either of the following Internet Browsers: [employer] Edge, or Google Chrome**  IF USING DOXIMITY or DOXY.ME - The patient will receive a link just prior to their visit by text.     FULL LENGTH CONSENT FOR TELE-HEALTH VISIT   I hereby voluntarily request, consent and authorize [facility] and its employed or contracted physicians, physician assistants, nurse practitioners or other licensed health care professionals (the Practitioner), to provide me with telemedicine health care services (the Services") as deemed necessary by the treating Practitioner. I acknowledge and consent to receive the Services by the Practitioner via telemedicine. I understand that the telemedicine visit will involve communicating with the Practitioner through live audiovisual communication technology and the disclosure of certain medical information by electronic transmission. I acknowledge that I have been given the opportunity to request an in-person assessment or other available alternative prior to the telemedicine visit and am voluntarily participating in the telemedicine visit.  I understand that I have the right to withhold or withdraw my consent to the use of telemedicine in the course of my care at any time, without affecting my right to future care or treatment, and that  the Practitioner or I may terminate the telemedicine visit at any time. I understand that I have the right to inspect all information obtained and/or recorded in the course of the telemedicine visit and may receive copies of available information for a reasonable fee.  I understand that some of the potential risks of receiving the Services via telemedicine include:   Delay or interruption in medical evaluation due to technological equipment failure or disruption;  Information transmitted may not be sufficient (e.g. poor resolution of images) to allow for appropriate medical decision making by the Practitioner; and/or   In rare instances, security protocols could fail, causing a breach of personal health information.  Furthermore, I acknowledge that it is my responsibility to provide information about my medical history, conditions and care that is complete and accurate to the best of my ability. I acknowledge that Practitioner's advice, recommendations, and/or decision may be based on factors not within their control, such as incomplete or inaccurate data provided by me or distortions of diagnostic images or specimens that may result from electronic transmissions.  I understand that the practice of medicine is not an exact science and that Practitioner makes no warranties or guarantees regarding treatment outcomes. I acknowledge that I will receive a copy of this consent concurrently upon execution via email to the email address I last provided but may also request a printed copy by calling the office of [place].    I understand that my insurance will be billed for this visit.   I have read or had this consent read to me.  I understand the contents of this consent, which adequately explains the benefits and risks of the Services being provided via telemedicine.   I have been provided ample opportunity to ask questions regarding this consent and the Services and have had my questions answered to  my satisfaction.  I give my informed consent for the services to be provided through the use of telemedicine in my medical care  By participating in this telemedicine visit I agree to the above.

## 2018-07-26 ENCOUNTER — Telehealth: Payer: BLUE CROSS/BLUE SHIELD | Admitting: Cardiology

## 2018-08-09 ENCOUNTER — Other Ambulatory Visit: Payer: Self-pay

## 2018-08-09 ENCOUNTER — Encounter: Payer: Self-pay | Admitting: Cardiology

## 2018-08-09 ENCOUNTER — Telehealth (INDEPENDENT_AMBULATORY_CARE_PROVIDER_SITE_OTHER): Payer: BC Managed Care – PPO | Admitting: Cardiology

## 2018-08-09 VITALS — BP 118/63 | HR 68 | Ht 67.0 in | Wt 240.0 lb

## 2018-08-09 DIAGNOSIS — R002 Palpitations: Secondary | ICD-10-CM | POA: Diagnosis not present

## 2018-08-09 NOTE — Patient Instructions (Signed)
Medication Instructions:  Your physician recommends that you continue on your current medications as directed. Please refer to the Current Medication list given to you today. If you need a refill on your cardiac medications before your next appointment, please call your pharmacy.   Lab work: None If you have labs (blood work) drawn today and your tests are completely normal, you will receive your results only by: Marland Kitchen MyChart Message (if you have MyChart) OR . A paper copy in the mail If you have any lab test that is abnormal or we need to change your treatment, we will call you to review the results.  Testing/Procedures: None  Follow-Up: At [facility], you and your health needs are our priority.  As part of our continuing mission to provide you with exceptional heart care, we have created designated Provider Care Teams.  These Care Teams include your primary Cardiologist (physician) and Advanced Practice Providers (APPs -  Physician Assistants and Nurse Practitioners) who all work together to provide you with the care you need, when you need it. You will need a follow up appointment in as needed .  Please call our office 2 months in advance to schedule this appointment

## 2018-08-09 NOTE — Progress Notes (Signed)
Virtual Visit via Video Note   This visit type was conducted due to national recommendations for restrictions regarding the COVID-19 Pandemic (e.g. social distancing) in an effort to limit this patient's exposure and mitigate transmission in our community.  Due to her co-morbid illnesses, this patient is at least at moderate risk for complications without adequate follow up.  This format is felt to be most appropriate for this patient at this time.  All issues noted in this document were discussed and addressed.  A limited physical exam was performed with this format.  Please refer to the patient's chart for her consent to telehealth for [facility].   Date:  08/09/2018   ID:  Cynthia English, DOB November 23, 1985, MRN [id number]  Patient Location: Home Provider Location: Office  PCP:  Patient, No Pcp Per  Cardiologist:  No primary care provider on file.  Electrophysiologist:  None   Evaluation Performed:  Follow-Up Visit  Chief Complaint: Palpitations  History of Present Illness:    Cynthia English is a 33 y.o. female with past medical history of palpitations.  This happened when she was pregnant.  Subsequently she is done fine.  She has had a baby by C-section and she tells me that her blood pressure has now come to normal her heart rate is fine she has no symptoms with palpitations and she is very active and is gone back to work.  She is very happy about it.  At the time of my evaluation, the patient is alert awake oriented and in no distress.  The patient does not have symptoms concerning for COVID-19 infection (fever, chills, cough, or new shortness of breath).    Past Medical History:  Diagnosis Date  . Anemia   . Blood transfusion without reported diagnosis   . GERD (gastroesophageal reflux disease)   . Heart palpitations   . History of kidney stones   . Kidney stones   . Newborn product of in vitro fertilization (IVF) pregnancy    Past Surgical History:  Procedure Laterality  Date  . CESAREAN SECTION N/A 02/05/2017   Procedure: CESAREAN SECTION;  Surgeon: Linda Hedges, DO;  Location: [place];  Service: Obstetrics;  Laterality: N/A;  . CESAREAN SECTION N/A 04/29/2018   Procedure: CESAREAN SECTION;  Surgeon: Linda Hedges, DO;  Location: [facility];  Service: Obstetrics;  Laterality: N/A;  . CHOLECYSTECTOMY N/A 05/12/2017   Procedure: LAPAROSCOPIC CHOLECYSTECTOMY;  Surgeon: Clovis Riley, MD;  Location: [facility];  Service: General;  Laterality: N/A;  . DILATION AND CURETTAGE OF UTERUS    . FOOT SURGERY Bilateral   . PLANTAR FASCIA RELEASE    . toe rotation  2004     Current Meds  Medication Sig  . Prenatal Vit-Fe Fumarate-FA (PRENATAL MULTIVITAMIN) TABS tablet Take 1 tablet by mouth daily at 12 noon.     Allergies:   Prednisone   Social History   Tobacco Use  . Smoking status: Never Smoker  . Smokeless tobacco: Never Used  Substance Use Topics  . Alcohol use: No  . Drug use: No     Family Hx: The patient's family history includes Arrhythmia in her father; Breast cancer in her paternal aunt and paternal grandmother; Colon cancer in her paternal grandfather; Diabetes in her paternal aunt; Hypertension in her father, maternal grandmother, and paternal grandmother; Supraventricular tachycardia in her father.  ROS:   Please see the history of present illness.    As mentioned above All other systems reviewed and  are negative.   Prior CV studies:   The following studies were reviewed today:  Event monitor Holter monitor and echocardiogram reports were discussed with the patient at length they were within normal limits.  Labs/Other Tests and Data Reviewed:    EKG:  No ECG reviewed.  Recent Labs: 01/25/2018: TSH 1.720 04/30/2018: Hemoglobin 10.4; Platelets 243   Recent Lipid Panel No results found for: CHOL, TRIG, HDL, CHOLHDL, LDLCALC, LDLDIRECT  Wt Readings from Last 3 Encounters:  08/09/18 240 lb (108.9 kg)  04/27/18 272 lb (123.4  kg)  04/17/18 269 lb 4.8 oz (122.2 kg)     Objective:    Vital Signs:  BP 118/63   Pulse 68   Ht 5\' 7"  (1.702 m)   Wt 240 lb (108.9 kg)   BMI 37.59 kg/m    VITAL SIGNS:  reviewed  ASSESSMENT & PLAN:    1. Palpitations: These have resolved I discussed this with the patient at extensive length.  She is happy about it.  She leads an active lifestyle.  She has no issues at this time.  Her blood pressure was mildly elevated after pregnancy but now it has normalized.  She will be seen in follow-up appointment on a as needed basis only.  She had questions which were answered to her satisfaction  COVID-19 Education: The signs and symptoms of COVID-19 were discussed with the patient and how to seek care for testing (follow up with PCP or arrange E-visit).  The importance of social distancing was discussed today.  Time:   Today, I have spent 15 minutes with the patient with telehealth technology discussing the above problems.     Medication Adjustments/Labs and Tests Ordered: Current medicines are reviewed at length with the patient today.  Concerns regarding medicines are outlined above.   Tests Ordered: No orders of the defined types were placed in this encounter.   Medication Changes: No orders of the defined types were placed in this encounter.   Follow Up:  Virtual Visit or In Person prn  Signed, Garwin Brothersajan R Lejla Moeser, MD  08/09/2018 3:24 PM    [facility]

## 2019-03-08 DIAGNOSIS — D485 Neoplasm of uncertain behavior of skin: Secondary | ICD-10-CM | POA: Diagnosis not present

## 2019-03-08 DIAGNOSIS — L578 Other skin changes due to chronic exposure to nonionizing radiation: Secondary | ICD-10-CM | POA: Diagnosis not present

## 2019-03-08 DIAGNOSIS — D2239 Melanocytic nevi of other parts of face: Secondary | ICD-10-CM | POA: Diagnosis not present

## 2019-03-08 DIAGNOSIS — L739 Follicular disorder, unspecified: Secondary | ICD-10-CM | POA: Diagnosis not present

## 2019-03-08 DIAGNOSIS — D225 Melanocytic nevi of trunk: Secondary | ICD-10-CM | POA: Diagnosis not present

## 2019-03-18 IMAGING — US US ABDOMEN LIMITED
1 series · 14 of 25 positions shown · non-contrast
Comparison: Abdominal MRI August 13, 2015 and CT abdomen and pelvis
April 05, 2014

CLINICAL DATA: Upper abdominal pain

EXAM:
ULTRASOUND ABDOMEN LIMITED RIGHT UPPER QUADRANT

[Series 1: us abdomen limited · 0.16mm/px · 14 of 52 slices shown]
[im 1/52]
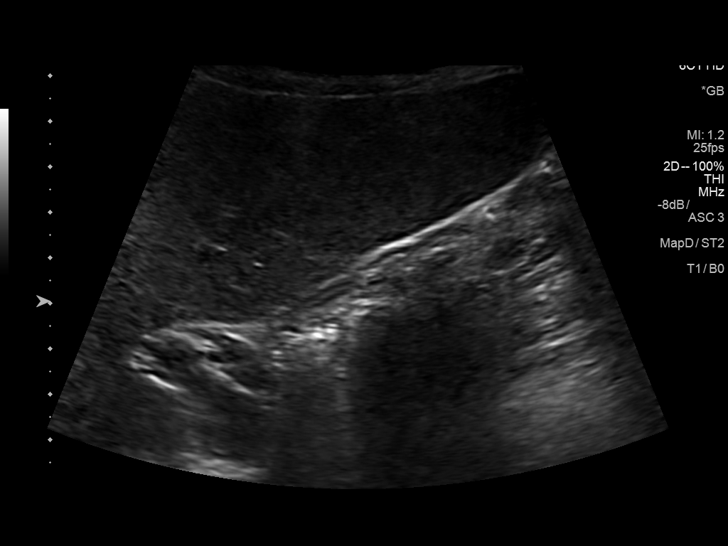
[im 5/52]
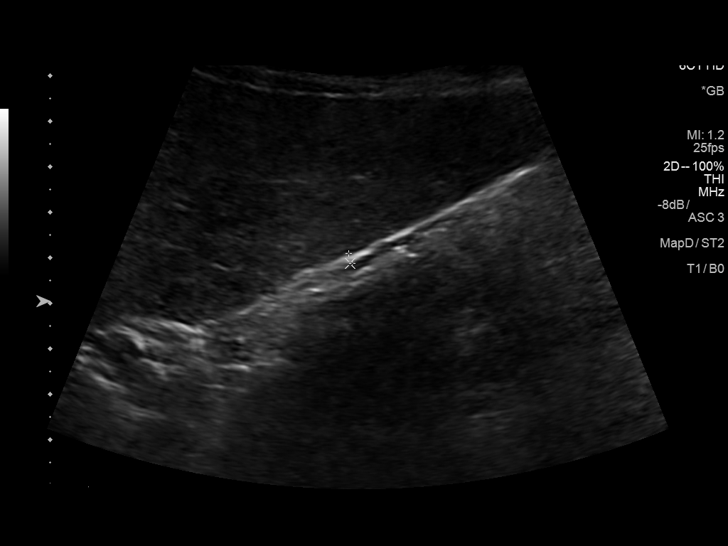
[im 9/52]
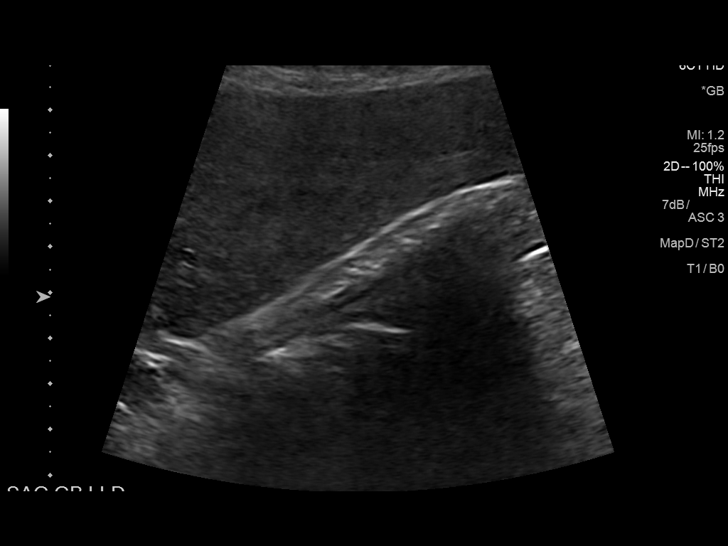
[im 13/52]
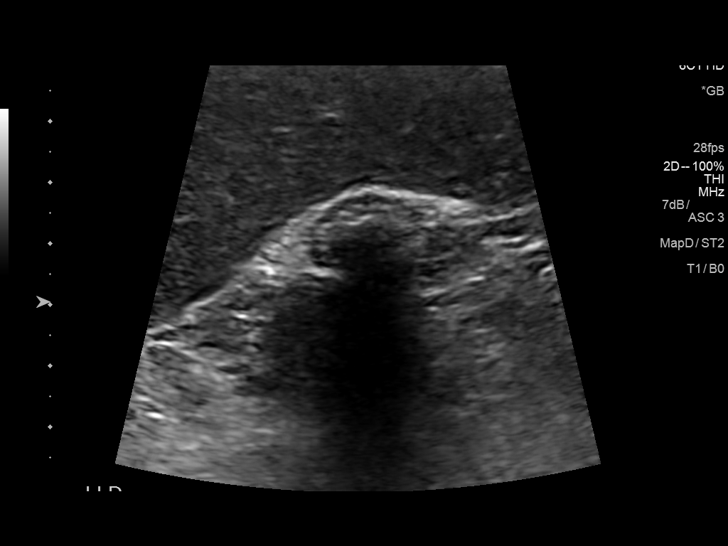
[im 18/52]
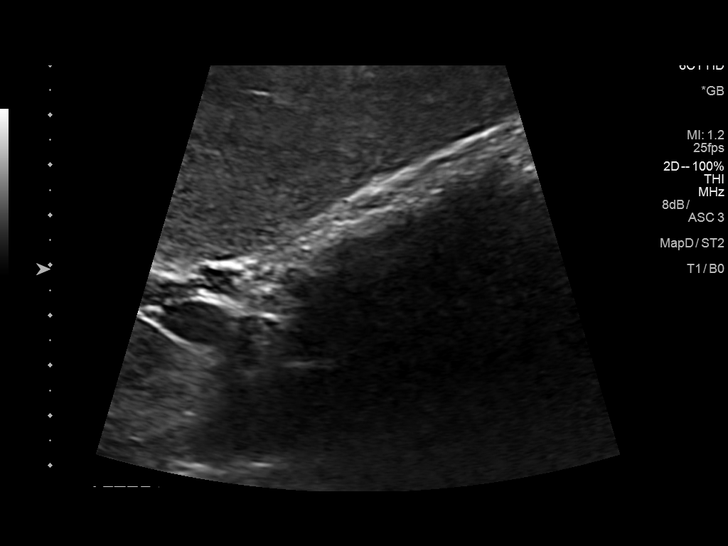
[im 20/52]
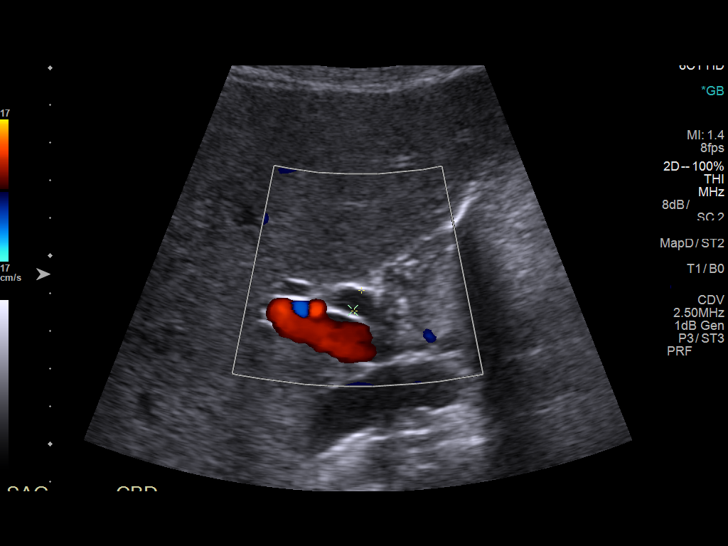
[im 24/52]
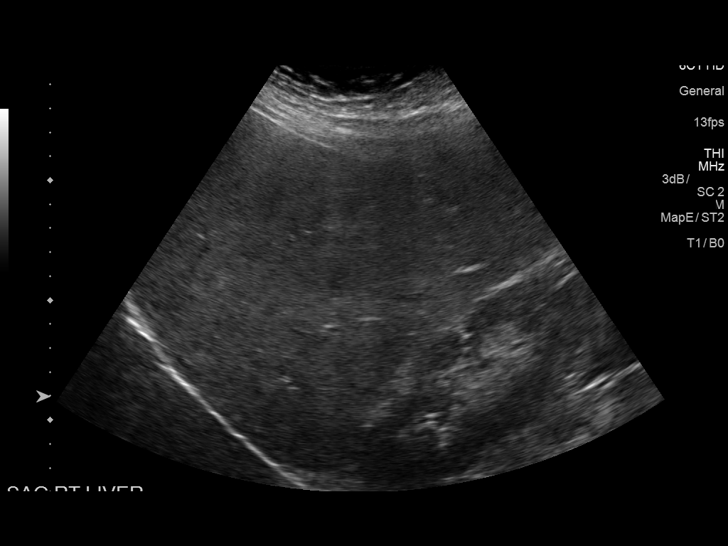
[im 28/52]
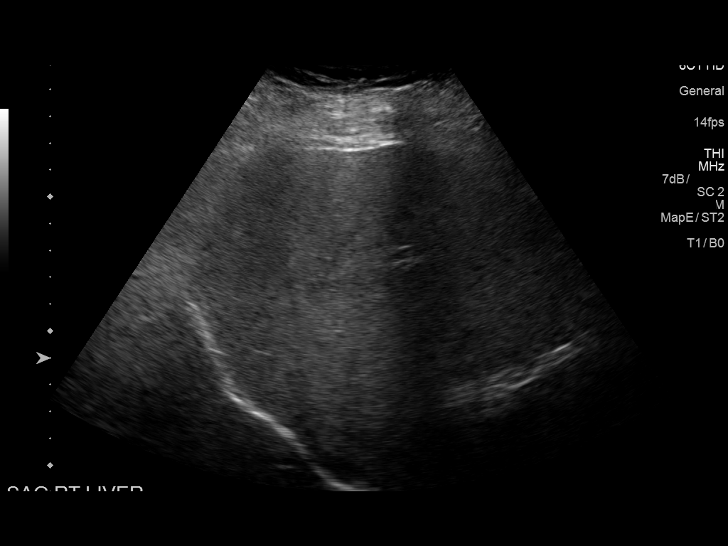
[im 32/52]
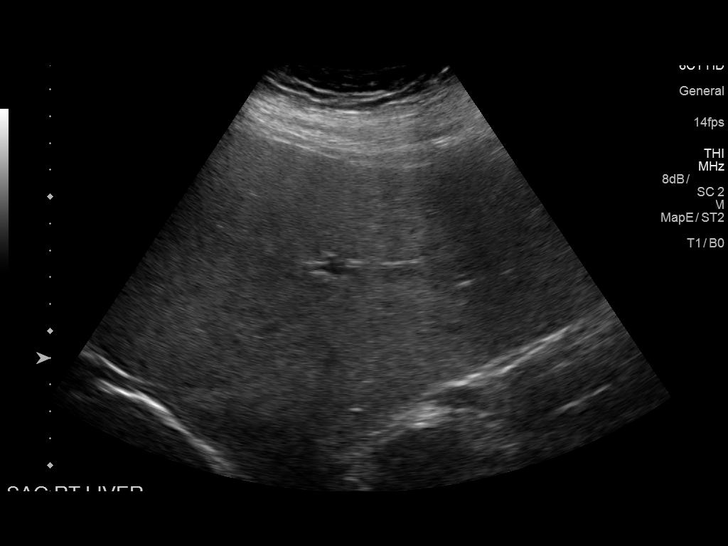
[im 35/52]
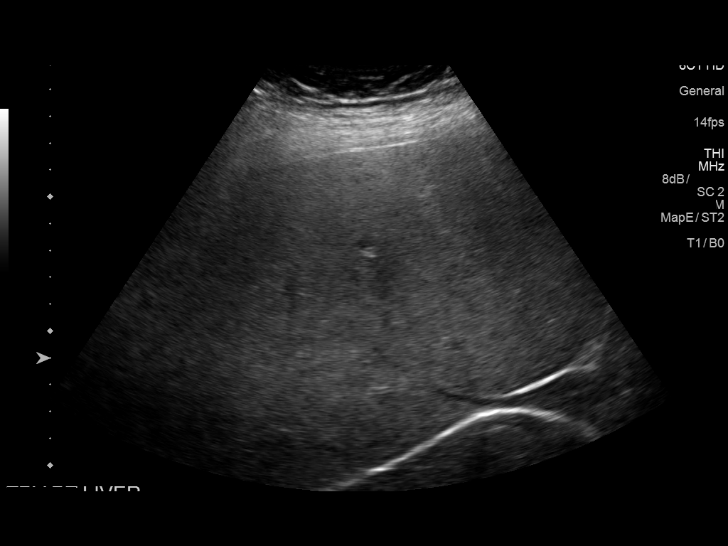
[im 39/52]
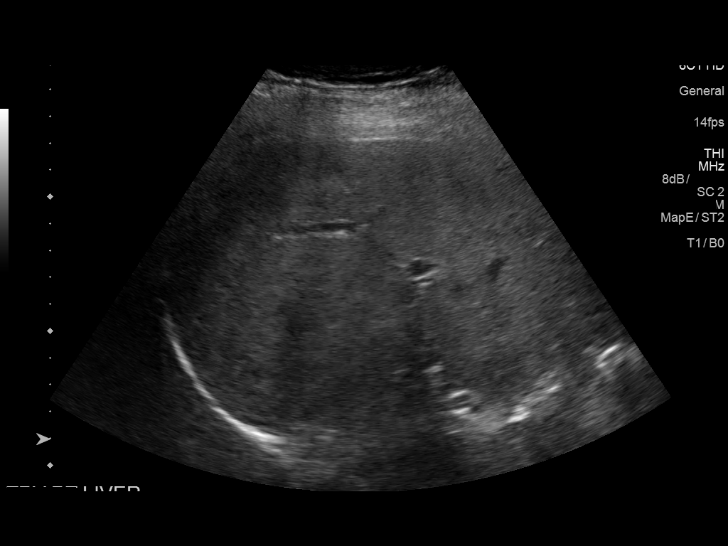
[im 43/52]
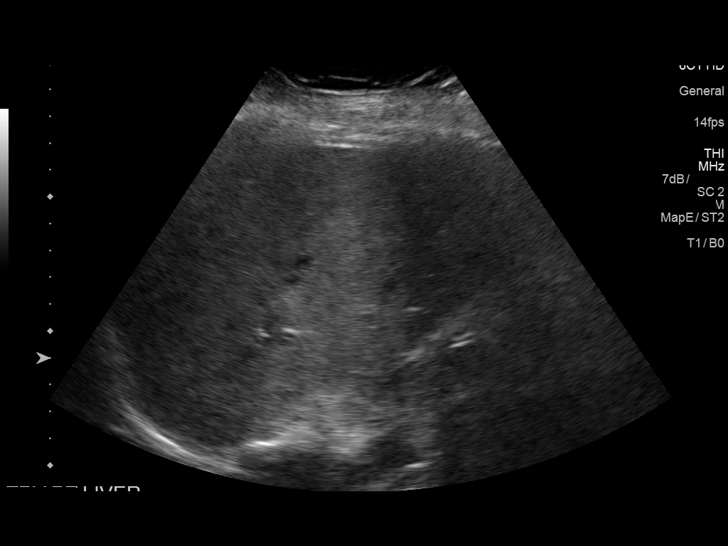
[im 47/52]
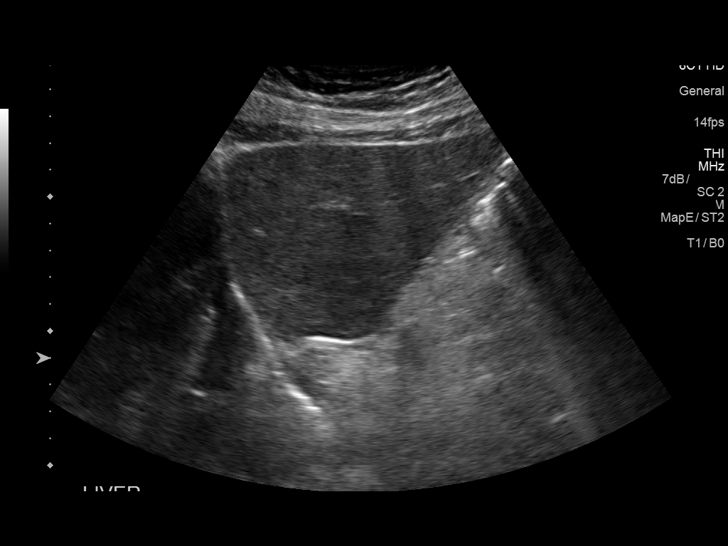
[im 52/52]
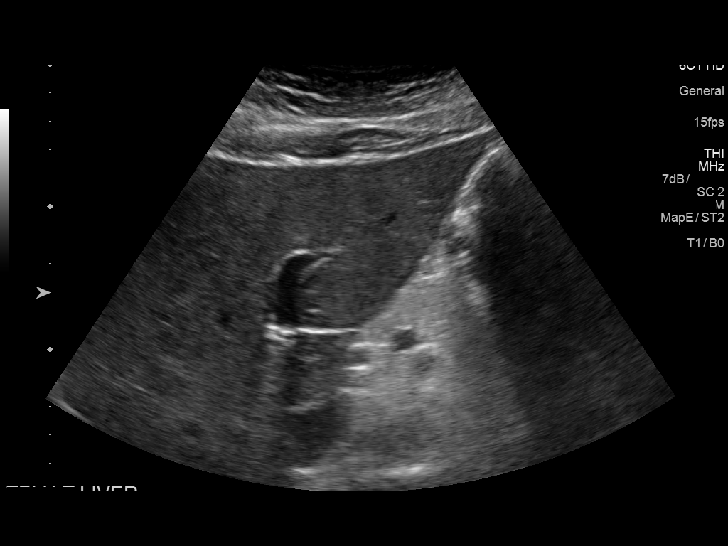

[14 of 25 positions shown; findings below may reference images not displayed]

FINDINGS: Gallbladder:

Gallbladder is contracted and filled with sludge and calculi.
Largest calculus measures 3 mm in length. There does not appear to
be gallbladder wall thickening or pericholecystic fluid. No
sonographic Murphy sign noted by sonographer.

Common bile duct:

Diameter: 6 mm. No intrahepatic or extrahepatic biliary duct
dilatation.

Liver:

No focal lesion identified. Liver echogenicity overall is
increased.. Portal vein is patent on color Doppler imaging with
normal direction of blood flow towards the liver.
IMPRESSION: 1. Gallbladder is contracted and filled with sludge and small
calculi. There is no appreciable gallbladder wall thickening or
pericholecystic fluid.

2. Increase in liver echogenicity, a finding felt to represent
hepatic steatosis. While no focal liver lesions are evident on this
study, it must be cautioned that the sensitivity of ultrasound for
detection of focal liver lesions is diminished in this circumstance.

## 2019-03-23 IMAGING — US US ABDOMEN LIMITED
1 series · 14 of 25 positions shown · non-contrast
Comparison: 04/20/2017

CLINICAL DATA: Right upper quadrant pain.

EXAM:
ULTRASOUND ABDOMEN LIMITED RIGHT UPPER QUADRANT

[Series 1: us abdomen limited · 0.26mm/px · 14 of 30 slices shown]
[im 1/30]
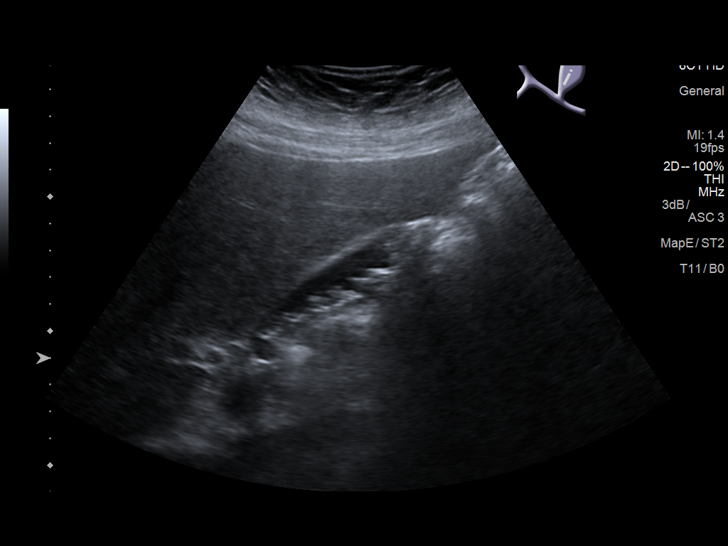
[im 3/30]
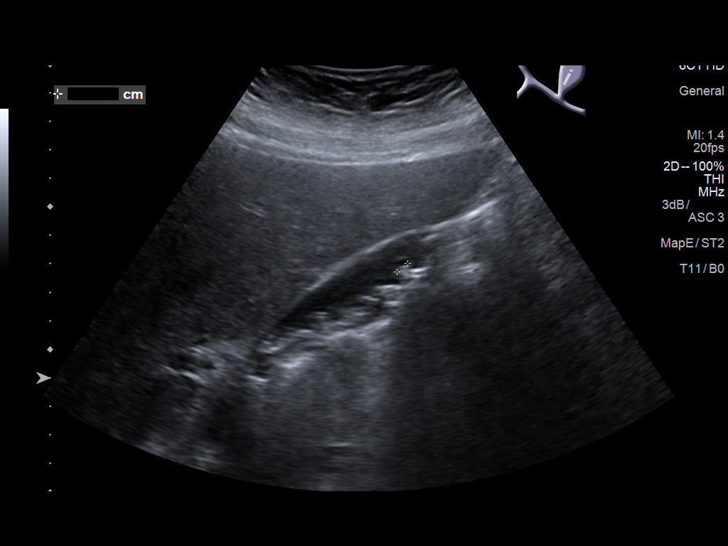
[im 5/30]
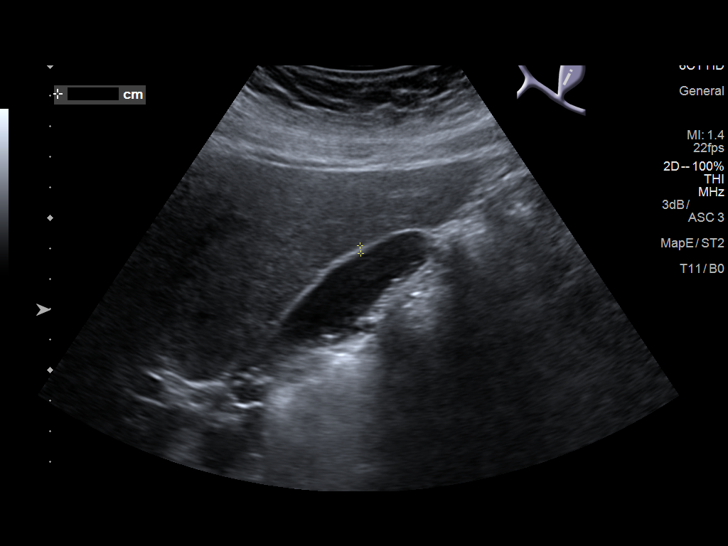
[im 8/30]
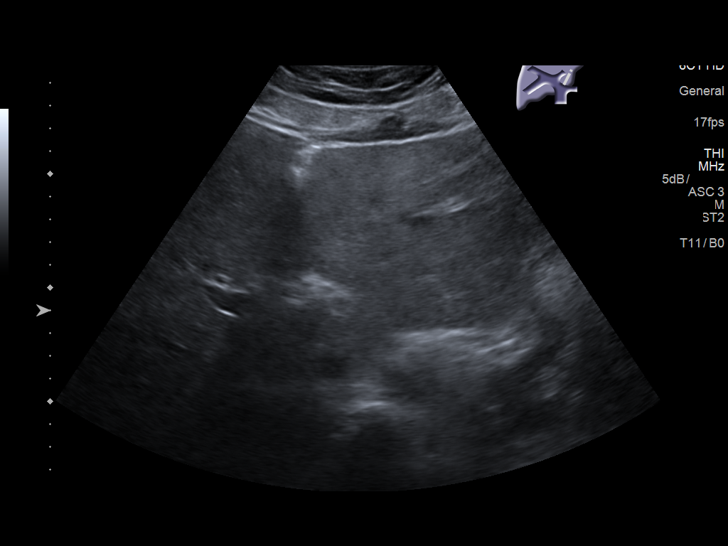
[im 10/30]
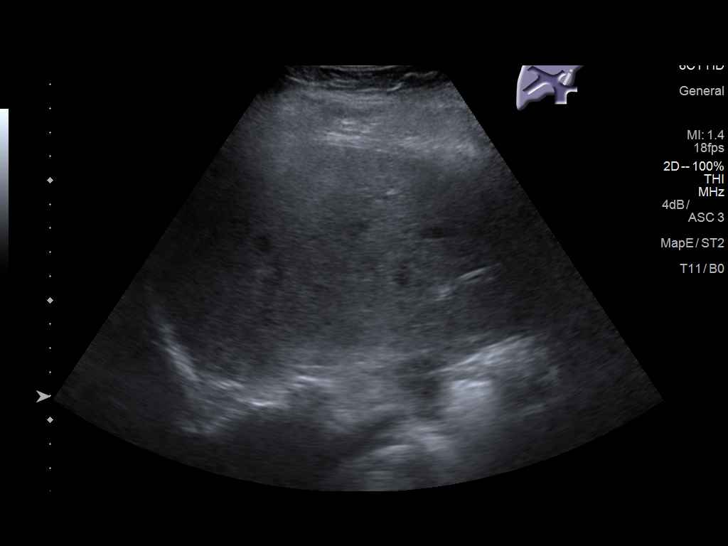
[im 11/30]
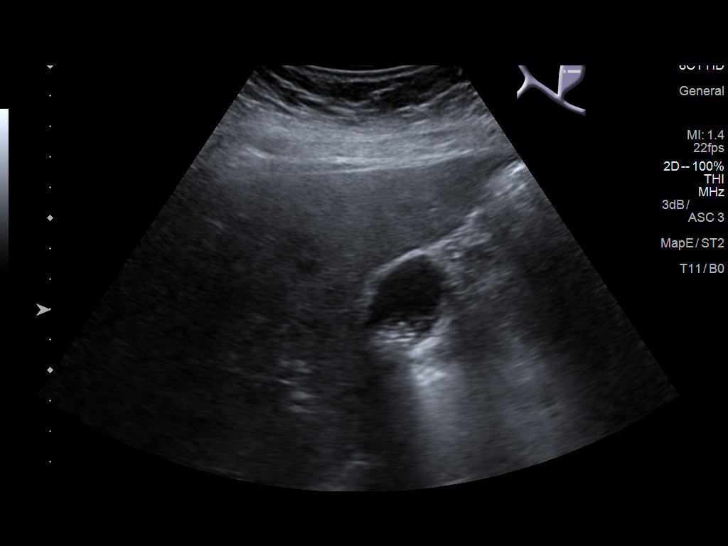
[im 14/30]
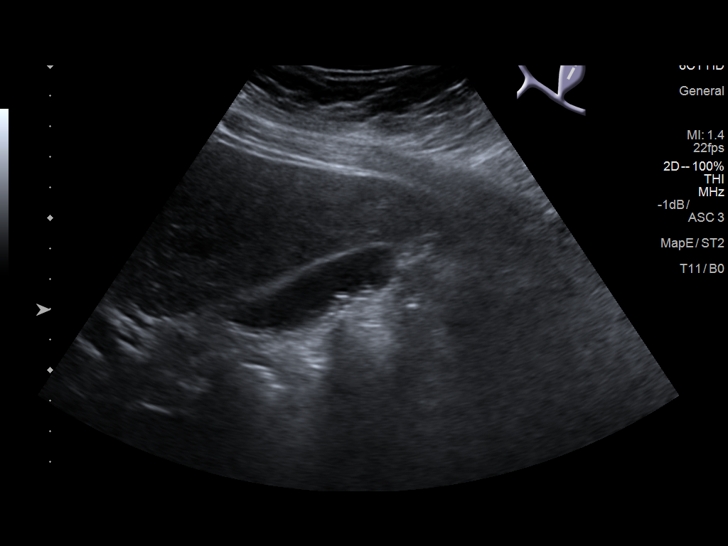
[im 16/30]
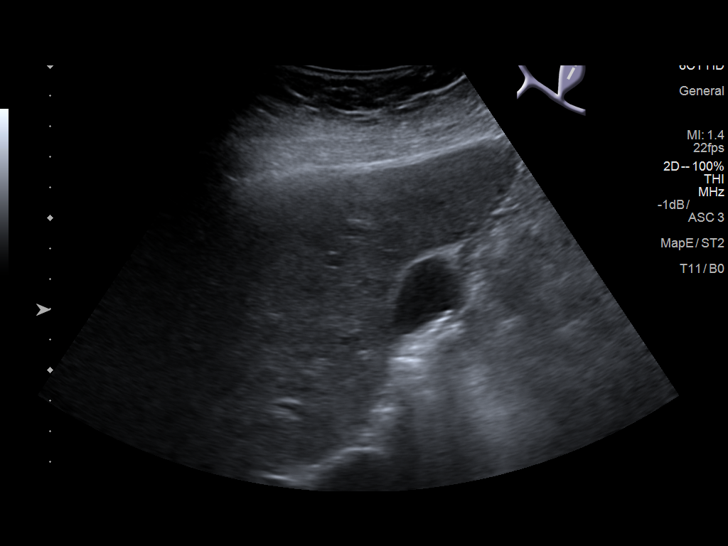
[im 19/30]
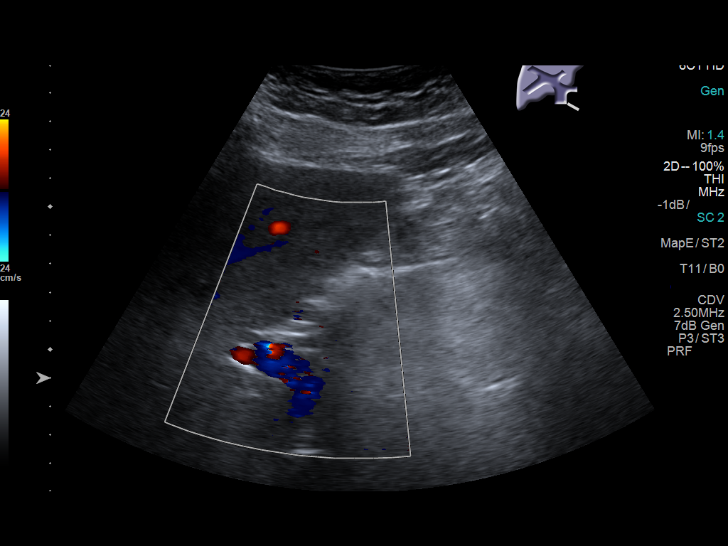
[im 20/30]
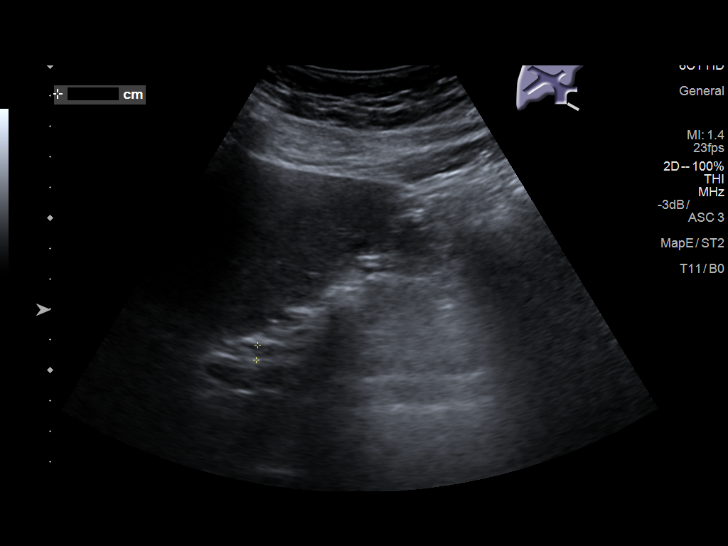
[im 22/30]
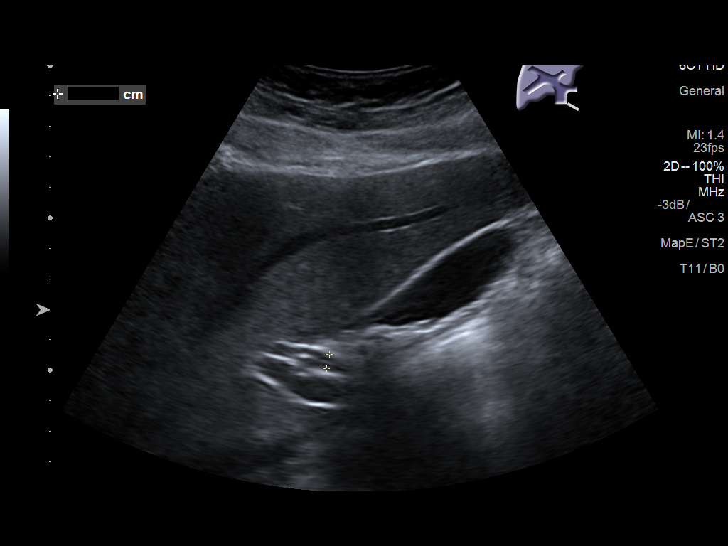
[im 25/30]
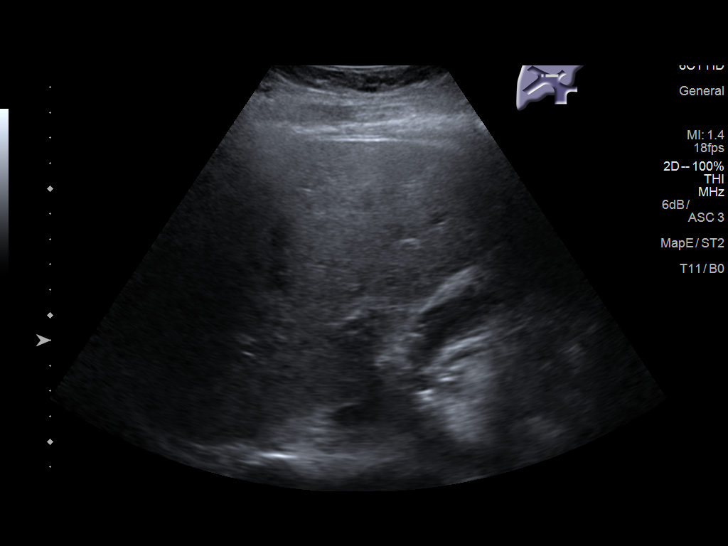
[im 27/30]
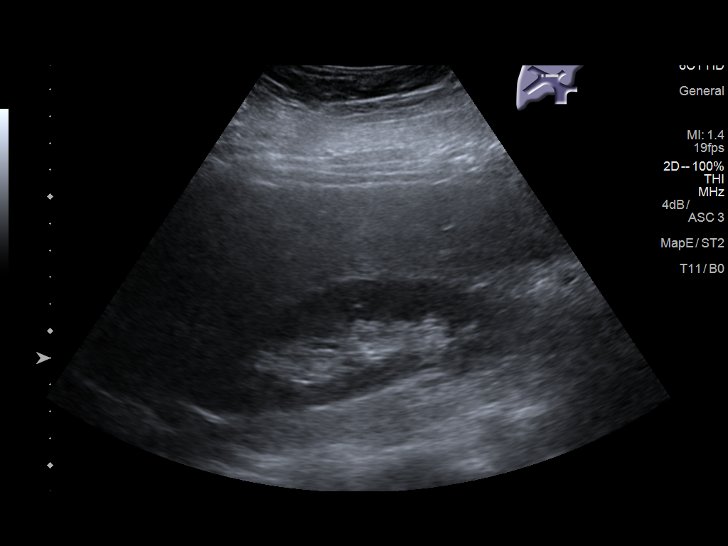
[im 30/30]
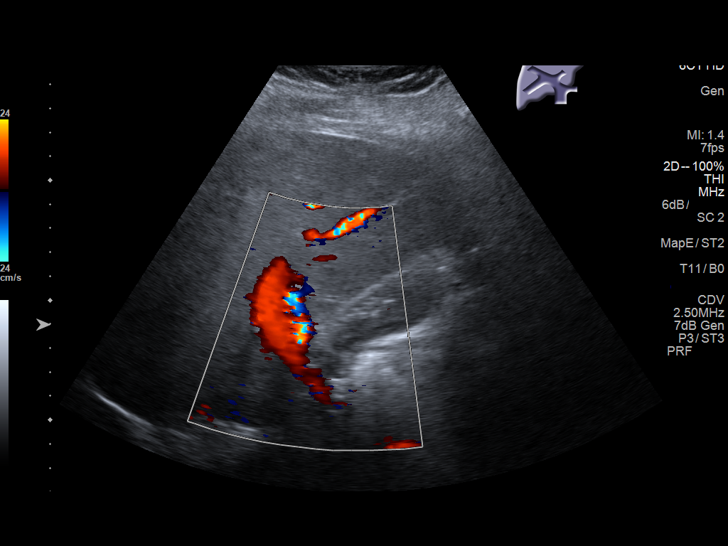

[14 of 25 positions shown; findings below may reference images not displayed]

FINDINGS: Gallbladder:

Multiple stones within the gallbladder measure up to 5 mm. No
gallbladder wall thickening or pericholecystic fluid. Negative
sonographic Murphy's sign.

Common bile duct:

Diameter: 4.8 mm

Liver:

No focal lesion identified. Mild increased parenchymal echogenicity.
Portal vein is patent on color Doppler imaging with normal direction
of blood flow towards the liver.
IMPRESSION: 1. Gallstones. No gallbladder wall thickening or pericholecystic
fluid.
2. Hepatic steatosis.

## 2019-05-18 DIAGNOSIS — J209 Acute bronchitis, unspecified: Secondary | ICD-10-CM | POA: Diagnosis not present

## 2019-05-18 DIAGNOSIS — J019 Acute sinusitis, unspecified: Secondary | ICD-10-CM | POA: Diagnosis not present

## 2019-05-18 DIAGNOSIS — H5213 Myopia, bilateral: Secondary | ICD-10-CM | POA: Diagnosis not present

## 2019-08-02 DIAGNOSIS — Z6841 Body Mass Index (BMI) 40.0 and over, adult: Secondary | ICD-10-CM | POA: Diagnosis not present

## 2019-08-02 DIAGNOSIS — Z01419 Encounter for gynecological examination (general) (routine) without abnormal findings: Secondary | ICD-10-CM | POA: Diagnosis not present

## 2019-08-30 DIAGNOSIS — Z299 Encounter for prophylactic measures, unspecified: Secondary | ICD-10-CM | POA: Diagnosis not present

## 2019-08-30 DIAGNOSIS — Z91013 Allergy to seafood: Secondary | ICD-10-CM | POA: Diagnosis not present

## 2019-08-30 DIAGNOSIS — J301 Allergic rhinitis due to pollen: Secondary | ICD-10-CM | POA: Diagnosis not present

## 2019-08-30 DIAGNOSIS — J3089 Other allergic rhinitis: Secondary | ICD-10-CM | POA: Diagnosis not present

## 2019-09-06 DIAGNOSIS — Z91013 Allergy to seafood: Secondary | ICD-10-CM | POA: Diagnosis not present

## 2019-10-24 DIAGNOSIS — U071 COVID-19: Secondary | ICD-10-CM | POA: Diagnosis not present

## 2019-12-10 DIAGNOSIS — S80869A Insect bite (nonvenomous), unspecified lower leg, initial encounter: Secondary | ICD-10-CM | POA: Diagnosis not present

## 2019-12-20 DIAGNOSIS — L300 Nummular dermatitis: Secondary | ICD-10-CM | POA: Diagnosis not present

## 2019-12-20 DIAGNOSIS — L299 Pruritus, unspecified: Secondary | ICD-10-CM | POA: Diagnosis not present

## 2020-01-01 DIAGNOSIS — R531 Weakness: Secondary | ICD-10-CM | POA: Diagnosis not present

## 2020-01-01 DIAGNOSIS — L501 Idiopathic urticaria: Secondary | ICD-10-CM | POA: Diagnosis not present

## 2020-02-12 ENCOUNTER — Ambulatory Visit: Payer: BLUE CROSS/BLUE SHIELD | Admitting: Allergy and Immunology

## 2020-02-28 ENCOUNTER — Ambulatory Visit: Payer: Self-pay | Admitting: Allergy and Immunology

## 2020-03-21 ENCOUNTER — Ambulatory Visit (INDEPENDENT_AMBULATORY_CARE_PROVIDER_SITE_OTHER): Payer: BC Managed Care – PPO | Admitting: Allergy and Immunology

## 2020-03-21 ENCOUNTER — Encounter: Payer: Self-pay | Admitting: Allergy and Immunology

## 2020-03-21 ENCOUNTER — Other Ambulatory Visit: Payer: Self-pay

## 2020-03-21 VITALS — BP 112/72 | HR 96 | Resp 14 | Ht 65.7 in | Wt 261.2 lb

## 2020-03-21 DIAGNOSIS — T50905D Adverse effect of unspecified drugs, medicaments and biological substances, subsequent encounter: Secondary | ICD-10-CM

## 2020-03-21 DIAGNOSIS — T50905A Adverse effect of unspecified drugs, medicaments and biological substances, initial encounter: Secondary | ICD-10-CM | POA: Diagnosis not present

## 2020-03-21 DIAGNOSIS — J3089 Other allergic rhinitis: Secondary | ICD-10-CM

## 2020-03-21 NOTE — Progress Notes (Signed)
- [place] - [place] - [place] - [place]   NEW PATIENT NOTE  Referring Provider: No ref. provider found Primary Provider: Patient, No Pcp Per Date of office visit: 03/21/2020    Subjective:   Chief Complaint:  Cynthia English (DOB: 02-Feb-1985) is a 35 y.o. female who presents to the clinic on 03/21/2020 with a chief complaint of Medication Reaction .  HPI: Cynthia English presents to this clinic in evaluation of several issues.  First, Cynthia English has had a problem with the administration of systemic steroids.  In 2018 Cynthia English was given prednisone and developed a diffuse red blotchy dermatitis that was itchy that lasted about 2 weeks without any associated systemic or constitutional symptoms and never healed with scar or hyperpigmentation.  Then this past month Cynthia English was given a Kenalog injection for what sounded like bug bites and Cynthia English once again developed this global red flat itchy dermatitis that lasted 40 days.  Once again Cynthia English had no associated systemic or constitutional symptoms.  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  Cynthia English cheeks will swell up inside Cynthia English mouth when Cynthia English eats shrimp.  Cynthia English apparently had some skin testing performed in August 2021 and was found to be allergic to shrimp.  Cynthia English does have an injectable epinephrine device.  When Cynthia English ate crab on 1 occasion Cynthia English might of had a little bit of cheek swelling as well.  Third, Cynthia English has a history of allergic rhinoconjunctivitis that has improved as Cynthia English has aged.  Now Cynthia English just requires an occasional antihistamine.  Usually this shows up during the fall and spring.  When Cynthia English was skin tested back in August 2021 Cynthia English did demonstrate hypersensitivity to grass and trees and Cynthia English also had hypersensitivity directed against dust mite.  Cynthia English was infected with Covid in November 2021.  Cynthia English has not received a COVID vaccine.  Past Medical History:  Diagnosis Date  . Anemia   . Blood transfusion without reported diagnosis   . GERD (gastroesophageal  reflux disease)   . Heart palpitations   . History of kidney stones   . Kidney stones   . Newborn product of in vitro fertilization (IVF) pregnancy     Past Surgical History:  Procedure Laterality Date  . CESAREAN SECTION N/A 02/05/2017   Procedure: CESAREAN SECTION;  Surgeon: Mitchel Honour, DO;  Location: [facility];  Service: Obstetrics;  Laterality: N/A;  . CESAREAN SECTION N/A 04/29/2018   Procedure: CESAREAN SECTION;  Surgeon: Mitchel Honour, DO;  Location: [facility];  Service: Obstetrics;  Laterality: N/A;  . CHOLECYSTECTOMY N/A 05/12/2017   Procedure: LAPAROSCOPIC CHOLECYSTECTOMY;  Surgeon: Berna Bue, MD;  Location: [facility];  Service: General;  Laterality: N/A;  . DILATION AND CURETTAGE OF UTERUS  2016  . FOOT SURGERY Bilateral   . PLANTAR FASCIA RELEASE    . toe rotation  2004    Allergies as of 03/21/2020      Reactions   Kenalog [triamcinolone] Rash   Prednisone Rash      Medication List    Tri-Sprintec 0.18/0.215/0.25 MG-35 MCG tablet Generic drug: Norgestimate-Ethinyl Estradiol Triphasic Take 1 tablet by mouth daily.       Review of systems negative except as noted in HPI / PMHx or noted below:  Review of Systems  Constitutional: Negative.   HENT: Negative.   Eyes: Negative.   Respiratory: Negative.   Cardiovascular: Negative.   Gastrointestinal: Negative.   Genitourinary: Negative.   Musculoskeletal: Negative.   Skin: Negative.  Neurological: Negative.   Endo/Heme/Allergies: Negative.   Psychiatric/Behavioral: Negative.     Family History  Problem Relation Age of Onset  . Arrhythmia Father   . Supraventricular tachycardia Father   . Hypertension Father   . Diabetes Paternal Aunt   . Breast cancer Paternal Aunt   . Hypertension Maternal Grandmother   . Hypertension Paternal Grandmother   . Breast cancer Paternal Grandmother   . Colon cancer Paternal Grandfather     Social History   Socioeconomic History  . Marital status:  Married    Spouse name: Not on file  . Number of children: Not on file  . Years of education: Not on file  . Highest education level: Not on file  Occupational History  . Not on file  Tobacco Use  . Smoking status: Never Smoker  . Smokeless tobacco: Never Used  Vaping Use  . Vaping Use: Never used  Substance and Sexual Activity  . Alcohol use: No  . Drug use: No  . Sexual activity: Not on file  Other Topics Concern  . Not on file  Social History Narrative  . Not on file    Environmental and Social history  Lives in a house with a dry environment, no animals located in the hospital, carpet in the bedroom, plastic on the bed, no plastic on the pillow, no smoking ongoing with inside the household.  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  Objective:   Vitals:   03/21/20 1437  BP: 112/72  Pulse: 96  Resp: 14  SpO2: 97%   Height: 5' 5.7" (166.9 cm) Weight: 261 lb 3.2 oz (118.5 kg)  Physical Exam Constitutional:      Appearance: Cynthia English is not diaphoretic.  HENT:     Head: Normocephalic.     Right Ear: Tympanic membrane, ear canal and external ear normal.     Left Ear: Tympanic membrane, ear canal and external ear normal.     Nose: Nose normal. No mucosal edema or rhinorrhea.     Mouth/Throat:     Mouth: Oropharynx is clear and moist and mucous membranes are normal.     Pharynx: Uvula midline. No oropharyngeal exudate.  Eyes:     Conjunctiva/sclera: Conjunctivae normal.  Neck:     Thyroid: No thyromegaly.     Trachea: Trachea normal. No tracheal tenderness or tracheal deviation.  Cardiovascular:     Rate and Rhythm: Normal rate and regular rhythm.     Heart sounds: Normal heart sounds, S1 normal and S2 normal. No murmur heard.   Pulmonary:     Effort: No respiratory distress.     Breath sounds: Normal breath sounds. No stridor. No wheezing or rales.  Musculoskeletal:        General: No edema.  Lymphadenopathy:     Head:     Right side of head: No tonsillar adenopathy.      Left side of head: No tonsillar adenopathy.     Cervical: No cervical adenopathy.  Skin:    Findings: No erythema or rash.     Nails: There is no clubbing.  Neurological:     Mental Status: Cynthia English is alert.     Diagnostics: Allergy skin tests were not performed.    Assessment and Plan:    1. Drug reaction, subsequent encounter   2. Seasonal allergic rhinitis due to other allergic trigger    It sounds as though Cynthia English develops some form of inflammatory dermatosis or urticarial reaction after administration of systemic steroids.  This  has been a reproducible phenomena with subsequent exposures to systemic steroids.  Although very unusual and rare these steroid reactivities have been reported in the past and I think would be best for Cynthia English to not receive systemic steroids in the future if possible.  Cynthia English does appear to have some seasonal allergic rhinitis that does not require any evaluation or therapy other than some occasional over-the-counter antihistamines.  Cynthia English will follow-up in this clinic if needed.  Cynthia Schimke, MD Allergy / Immunology [facility]

## 2020-03-21 NOTE — Patient Instructions (Addendum)
  1. Continue a combination of H1 receptor blocker and H2 receptor blocker daily  2. Consider using oral albuterol in addition to histamine receptor blockers  2.  EpiPen, Benadryl, MD/ER evaluation for allergic reaction  3.  Obtain thyroid function test every 6 months  4.  Consider starting omalizumab injections  5.  If failure with omalizumab then consider starting dupilumab injections  6. Obtain 24-hour urine collection for histamine metabolites, 5 HIAA, leukotriene E4, prostaglandin F2 alpha  7. Obtain fall flu vaccine and RSV vaccine 

## 2020-03-25 ENCOUNTER — Encounter: Payer: Self-pay | Admitting: Allergy and Immunology

## 2020-03-26 ENCOUNTER — Encounter: Payer: Self-pay | Admitting: Allergy and Immunology
